# Patient Record
Sex: Male | Born: 1995
Health system: Southern US, Community
[De-identification: ages and names within clinical notes are randomized; demographics above are authoritative.]

## PROBLEM LIST (undated history)

## (undated) DIAGNOSIS — E669 Obesity, unspecified: Secondary | ICD-10-CM

## (undated) DIAGNOSIS — E7801 Familial hypercholesterolemia: Secondary | ICD-10-CM

## (undated) DIAGNOSIS — R011 Cardiac murmur, unspecified: Secondary | ICD-10-CM

## (undated) DIAGNOSIS — I10 Essential (primary) hypertension: Secondary | ICD-10-CM

## (undated) DIAGNOSIS — R7303 Prediabetes: Secondary | ICD-10-CM

## (undated) DIAGNOSIS — E301 Precocious puberty: Secondary | ICD-10-CM

## (undated) DIAGNOSIS — R1013 Epigastric pain: Secondary | ICD-10-CM

## (undated) DIAGNOSIS — K219 Gastro-esophageal reflux disease without esophagitis: Secondary | ICD-10-CM

## (undated) DIAGNOSIS — E063 Autoimmune thyroiditis: Secondary | ICD-10-CM

## (undated) DIAGNOSIS — N62 Hypertrophy of breast: Secondary | ICD-10-CM

## (undated) DIAGNOSIS — E049 Nontoxic goiter, unspecified: Secondary | ICD-10-CM

## (undated) HISTORY — DX: Nontoxic goiter, unspecified: E04.9

## (undated) HISTORY — DX: Essential (primary) hypertension: I10

## (undated) HISTORY — DX: Hypertrophy of breast: N62

## (undated) HISTORY — DX: Gastro-esophageal reflux disease without esophagitis: K21.9

## (undated) HISTORY — DX: Obesity, unspecified: E66.9

## (undated) HISTORY — PX: TONSILLECTOMY AND ADENOIDECTOMY: SHX28

## (undated) HISTORY — DX: Familial hypercholesterolemia: E78.01

## (undated) HISTORY — PX: TYMPANOSTOMY TUBE PLACEMENT: SHX32

## (undated) HISTORY — DX: Epigastric pain: R10.13

## (undated) HISTORY — DX: Precocious puberty: E30.1

## (undated) HISTORY — DX: Autoimmune thyroiditis: E06.3

## (undated) HISTORY — DX: Prediabetes: R73.03

---
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
## 2016-03-07 DIAGNOSIS — L0390 Cellulitis, unspecified: Secondary | ICD-10-CM | POA: Diagnosis not present

## 2016-03-14 DIAGNOSIS — L0390 Cellulitis, unspecified: Secondary | ICD-10-CM | POA: Diagnosis not present

## 2016-03-14 DIAGNOSIS — F411 Generalized anxiety disorder: Secondary | ICD-10-CM | POA: Diagnosis not present

## 2016-04-24 ENCOUNTER — Encounter: Payer: Self-pay | Admitting: "Endocrinology

## 2016-04-24 ENCOUNTER — Encounter: Payer: Self-pay | Admitting: *Deleted

## 2016-04-24 ENCOUNTER — Ambulatory Visit (INDEPENDENT_AMBULATORY_CARE_PROVIDER_SITE_OTHER): Payer: BLUE CROSS/BLUE SHIELD | Admitting: "Endocrinology

## 2016-04-24 VITALS — BP 116/75 | HR 56 | Wt 163.8 lb

## 2016-04-24 DIAGNOSIS — R1013 Epigastric pain: Secondary | ICD-10-CM | POA: Diagnosis not present

## 2016-04-24 DIAGNOSIS — E063 Autoimmune thyroiditis: Secondary | ICD-10-CM

## 2016-04-24 DIAGNOSIS — E038 Other specified hypothyroidism: Secondary | ICD-10-CM

## 2016-04-24 DIAGNOSIS — E049 Nontoxic goiter, unspecified: Secondary | ICD-10-CM

## 2016-04-24 LAB — T4, FREE: Free T4: 1 ng/dL (ref 0.8–1.4)

## 2016-04-24 LAB — T3, FREE: T3, Free: 3.4 pg/mL (ref 3.0–4.7)

## 2016-04-24 LAB — TSH: TSH: 3.49 mIU/L (ref 0.50–4.30)

## 2016-04-24 NOTE — Progress Notes (Signed)
Subjective:  Patient Name: Jeremy Duncan Date of Birth: 1996/02/25  MRN: [id number]  Jeremy Duncan  presents to the office today for follow-up of his acquired hypothyroidism, Hashimoto's disease, goiter, obesity, hyperlipidemia, hypertension, prediabetes, gynecomastia, dyspepsia, and GERD.  HISTORY OF PRESENT ILLNESS:   Jeremy Duncan is a 20 y.o. Caucasian young man. Jeremy Duncan was unaccompanied.  1. Patient was first referred to me on 10/21/07 by his PCP, Dr. Victorino Dike Summer of [facility], for evaluation and management of hypothyroidism. He was 11 years and 7 months old at the time.  A. The child was the product of an uncomplicated pregnancy. His mother had gestational diabetes mellitus. He was delivered by C-section. Birth weight was 6 lbs. 4 oz. He was a healthy newborn. He was also a healthy infant. Between the ages of 76 and 9 he experienced a marked increase in weight. One year he gained 20 pounds. He also developed hypertension. Laboratory tests on 06/13/07 showed a TSH of 6.09. Repeat studies on 06/17/07 showed a TSH of 6.283. Free T4 was 1.22. Dr. Vaughan Basta called me and I suggested that she start him on Synthroid 25 mcg per day. Thyroid function tests on 07/10/2007 showed that the TSH decreased to 4.055. I suggested that the Synthroid dose be increased to 37.5 mcg per day.   B. The patient's pertinent review of systems revealed that he was always hungry. He had been placed on Prevacid but was only taking it as needed. Previous laboratory tests had shown hypercholesterolemia and hyperinsulinemia. A fasting insulin value was 20 when the accompanying blood glucose was 85. Family history was positive for a maternal great-grandmother who took thyroid pills. It was believed that she had never had surgery or radiation to her neck. That same maternal great-grandmother had had cancer of her breast and bone. Both the father and mother had significant stomach acid issues.There was a family history of  overweight/obesity.   C. On physical examination his height was at the 75th percentile. His weight was at the 97th percentile. BMI was greater than the 97th percentile. His blood pressure was 131/86. He was definitely obese. He had a 12 g goiter. His right lobe was tender to palpation. Breast tissue was early Tanner stage II. The right areola was 22 mm. The left areola was 25 mm. He had Tanner stage II pubic hair. Testes were to 2-3 mL in volume. Lab tests that day showed a normal CMP. His TSH was 2.773. His free T4 was 1.14. His free T3 was 1.3. FSH was 2.8 and LH 0.4. Testosterone was 25.3. Estradiol was less than 10. Androstenedione was 65.  D. It appeared at that time that the patient had acquired primary hypothyroidism secondary to Hashimoto's disease. His thyroid gland was definitely tender, consistent with a flare-up of Hashimoto's disease at that time. He was obese, in part due to extreme hunger caused by dyspepsia. Family history was definitely positive for dyspepsia and overweight/obesity. Previous labs had shown hyperinsulinemia. Therefore, it was predictable that his testosterone would be slightly elevated for age. I felt that his hypertension was most likely a result of his obesity and that if he could lose enough weight, the hypertension would likely resolve as well. I started the child on metformin, 500 mg twice daily. I discussed with the family our Eat Right diet plan and our Exercise Right plan.  2. During the past 9 years, Jeremy Duncan has lost some additional thyroid cells. As a result, I increased his Synthroid dose to 50 mcg/day. Since then he  has remained euthyroid. During the first year, his weight dropped from the 97th to the 75th percentile, but then gradually rose again to about the 82nd percentile. He had a growth spurt at age 86. When his weight decreased to the 75th percentile, his blood pressure decreased to 111/69. When the weight then increased to the 82nd percentile, the blood  pressure increased to 125/82. His weight percentile peaked at 88% in October 2013, in part due to a large increase in muscle mass. The gynecomastia has gradually improved over time.   3. The patient's last [facility] visit was on 10/20/15. In the interim, he has been healthy. He feels good, has been working out a lot, and has been taking in fewer sugary drinks and fewer food carbs.  He has definitely gained muscle. He works out with weight 2-3 days per week and runs 2-3 days per week. He stopped taking metformin, 500 mg, twice daily due to not feeling it was needed. He does take his Synthroid, 50 mcg once daily.   4. Pertinent Review of Systems:  Constitutional: The patient  feels "good". Energy level is pretty good. He is active. Eyes: Vision has improved so he only needs to wear his glasses for distance. There are no other recognized eye problems. Neck: The patient has no complaints of anterior neck swelling, soreness, tenderness, pressure, discomfort, or difficulty swallowing.   Heart: Heart rate increases with exercise or other physical activity. The patient has no complaints of palpitations, irregular heart beats, chest pain, or chest pressure.   Gastrointestinal: He is not bothered by "belly hunger". Bowel movents seem normal. The patient has no complaints of acid reflux, upset stomach, stomach aches or pains, diarrhea, or constipation.  Hands: He texts and plays video games quite well.  Legs: Muscle mass and strength seem normal. There are no complaints of numbness, tingling, burning, or pain. No edema is noted.  Feet: There are no obvious foot problems. There are no complaints of numbness, tingling, burning, or pain. No edema is noted. Neurologic: There are no recognized problems with muscle movement and strength, sensation, or coordination. Breasts: Breast tissue has resolved. Skin: His striae have resolved.      PAST MEDICAL, FAMILY, AND SOCIAL HISTORY  Past Medical History  Diagnosis Date   . Acquired autoimmune hypothyroidism   . Thyroiditis, autoimmune   . Goiter   . Obesity   . Hyperlipidemia type II   . Hypertension   . Prediabetes   . Isosexual precocity   . Gynecomastia, male   . Dyspepsia   . GERD (gastroesophageal reflux disease)     Family History  Problem Relation Age of Onset  . Diabetes Mother     Gestational diabetes mellitus     Current outpatient prescriptions:  .  SYNTHROID 50 MCG tablet, TAKE 1 TABLET BY MOUTH EVERY DAY, Disp: 90 tablet, Rfl: 1 .  metFORMIN (GLUCOPHAGE) 500 MG tablet, TAKE 1 TABLET BY MOUTH TWICE A DAY (Patient not taking: Reported on 04/24/2016), Disp: 180 tablet, Rfl: 3   reports that he has never smoked. He has never used smokeless tobacco. Pediatric History  Patient Guardian Status  . Mother:  [place], [place]   Other Topics Concern  . Not on file   Social History Narrative   1. School and Family: The patient just finished his freshman year at [employer].  He will major in interior architecture.   2. Activities: He lifts weights 2-3 times per week. He runs 1-2 times per week for 30-60 minutes.  3. [facility] Care Provider[facility]ean [facility]  REVIEW OF SYSTEMS: There are no other significant problems involving Quy's other body systems.   Objective:  Vital Signs:  BP 116/75 mmHg  Pulse 56  Wt 163 lb 12.8 oz (74.299 kg)   Ht Readings from Last 3 Encounters:  12/28/14 5' 9.21" (1.758 m) (49 %*, Z = -0.03)  08/26/14 5' 9.37" (1.762 m) (53 %*, Z = 0.06)  03/18/13 5' 8.9" (1.75 m) (58 %*, Z = 0.20)   * Growth percentiles are based on CDC 2-20 Years data.   Wt Readings from Last 3 Encounters:  04/24/16 163 lb 12.8 oz (74.299 kg) (66 %*, Z = 0.42)  10/20/15 167 lb (75.751 kg) (73 %*, Z = 0.60)  05/31/15 169 lb 4.8 oz (76.794 kg) (77 %*, Z = 0.74)   * Growth percentiles are based on CDC 2-20 Years data.   There is no height on file to calculate BSA.  PHYSICAL EXAM:  Constitutional: The patient  appears healthy and much older and more mature than his chronologic age. He is definitely quite muscular. He is a very bright and very emotionally mature and socially mature young man.The patient's height growth has levelled off at the 49%. His weight percentile has decreased to the 66.34%. He is very muscular and so his weight is normal for his degree of increased muscle mass.   Head: The head is normocephalic. Face: The face appears normal. There are no obvious dysmorphic features. Eyes: The eyes appear to be normally formed and spaced. Gaze is conjugate. There is no obvious arcus or proptosis. Moisture appears normal. Ears: The ears are normally placed and appear externally normal. Mouth: The oropharynx and tongue appear normal. Dentition appears to be normal for age. Oral moisture is normal. Neck: The neck appears to be visibly normal. No carotid bruits are noted. The strap muscles are about 2-3 times thicker than they were 3-4 years ago, c/w weight lifting. It is more difficult to assess thyroid gland size now, but his thyroid is probably smaller at about 22 grams in size. Both lobes are symmetrically enlarged. The consistency of the thyroid gland is fairly firm bilaterally. The thyroid gland is not tender to palpation. Lungs: The lungs are clear to auscultation. Air movement is good. Heart: Heart rate and rhythm are regular. Heart sounds S1 and S2 are normal. I did not appreciate any pathologic cardiac murmurs. Abdomen: The abdomen is normal in size for the patient's age. Bowel sounds are normal. There is no obvious hepatomegaly, splenomegaly, or other mass effect.  Arms: Muscle size and bulk are normal for age. Hands: There is no tremor. Phalangeal and metacarpophalangeal joints are normal. Palmar muscles are normal for age. He has no significant palmar erythema. He has no excess palmar moisture.  Legs: Muscles appear normal for age. No edema is present. Neurologic: Strength is normal for age in  both the upper and lower extremities. Muscle tone is normal. Sensation to touch is normal in both legs.   Skin: He has no striae today.   LAB DATA:   Labs 10/23/15: TSH 1.657, free T4 1.20, free T3 3.5  Labs 05/31/15: HbA1c 5.1%; TSH 2.318, free T4 1.26, free T3 3.9  Labs 11/09/14: 24-hour urine free cortisol 3.6, compared with 65.9 three months previously. 24-hour urine creatinine was 2090; TSH 2.836, free T4 1.04, free T3 4.0  Labs 08/22/14: HbA1c 5.6%, compared with 5.1% art last visit and with 4.4% two years ago. TSH  2.675, free T4 1.18, free T3 4.4  Labs 02/24/13: TSH 1.986, free T4 1.17, free T3 3.7   Labs 12/28/11: TSH 2.544, Free T4 1.49, Free T3 4.1   Labs 05/13/11: TSH 1.642. Free T4 1.21. Free T3 4.0.   Assessment and Plan:   ASSESSMENT:  1.  Hypothyroid: The patient was euthyroid in January 2013, in March 2014, in September 2015, in December 2015, although his TSH had been >2.0. In November 2016, however, his TFTs were mid-range normal. His treatment TSH goal is between 1.0-2.0. 2.  Thyroiditis: His thyroid inflammation is clinically quiescent. The shift of all three TFTs upward or downward together that he has had in the past is pathognomonic for a flare up of Hashimoto's disease.  3.  Goiter: Thyroid gland is slightly smaller than at last visit. The waxing and waning of the thyroid gland size is consistent with episodes of thyroid inflammation. 4.  Overweight: Jeremy Duncan is muscular. He is not overweight. 5. Hypertension: His SBP is good. His DBP is still a bit high. The hypertension will probably resolve if he continues to exercise and is careful with what he eats, but he may need antihypertensive medications in the future.     6. Dyspepsia: This problem is quiescent.    7. Pre-diabetes: His A1c was mid-range normal again at his prior visit. Unless he gains a lot of fat weight there will not be any need to follow this problem.  8. Striae: His striae have resolved.   PLAN:  1.  Diagnostic: TFTs  this week and again in 6 months. 2. Therapeutic:  Continue current doses of Synthroid. Try to fit in daily cardio exercise. I encouraged him to drink more water.  3. Patient education: We talked about the need to continue to eat right and to exercise. We also talked about the natural course of Hashimoto's disease and hypothyroidism. 4. Follow-up: 6 months  Level of Service: This visit lasted in excess of 45 minutes. More than 50% of the visit was devoted to counseling.  David Stall, MD  ADDENDUM:   Labs 04/24/16: TSH 3.49, free T4 1.0, free T3 3.4  Assessment: Jeremy Duncan is hypothyroid again. I called him. He admitted that he had missed several doses of Synthroid in the week prior to getting the lab tests done.  Plan: Repeat TFTs in 2 months. Decide then whether or not to increase his synthroid dose.   David Stall

## 2016-04-24 NOTE — Patient Instructions (Addendum)
Follow up visit in late November or early December.  Please repeat lab tests one week prior.

## 2016-04-25 ENCOUNTER — Telehealth: Payer: Self-pay | Admitting: "Endocrinology

## 2016-04-25 DIAGNOSIS — E063 Autoimmune thyroiditis: Secondary | ICD-10-CM

## 2016-04-25 NOTE — Telephone Encounter (Signed)
1. I called [place] to notify him that his TFTs from yesterday were hypothyroid.  2. Subjective; He now admits that he missed several Synthroid doses in the week prior to seeing me and having his TFTs drawn. 3. Assessment: He may have missed just enough doses to cause his TSH top rise but keep his free T4 and free T3 fairly good. 4. Plan: Will repeat TFTs in 2 months and decide then whether or not to increase his Synthroid dose.  David StallBRENNAN,Maciel Kegg J

## 2016-05-30 ENCOUNTER — Other Ambulatory Visit: Payer: Self-pay | Admitting: "Endocrinology

## 2016-10-05 ENCOUNTER — Other Ambulatory Visit (INDEPENDENT_AMBULATORY_CARE_PROVIDER_SITE_OTHER): Payer: Self-pay | Admitting: *Deleted

## 2016-10-05 DIAGNOSIS — E034 Atrophy of thyroid (acquired): Secondary | ICD-10-CM

## 2016-10-05 DIAGNOSIS — R7303 Prediabetes: Secondary | ICD-10-CM

## 2016-10-05 MED ORDER — METFORMIN HCL 500 MG PO TABS: 500.0000 mg | ORAL_TABLET | Freq: Two times a day (BID) | ORAL | 4 refills | Status: DC

## 2016-10-05 MED ORDER — SYNTHROID 50 MCG PO TABS: 50.0000 ug | ORAL_TABLET | Freq: Every day | ORAL | 4 refills | Status: DC

## 2016-10-21 DIAGNOSIS — Z23 Encounter for immunization: Secondary | ICD-10-CM | POA: Diagnosis not present

## 2016-10-30 ENCOUNTER — Ambulatory Visit (INDEPENDENT_AMBULATORY_CARE_PROVIDER_SITE_OTHER): Payer: Self-pay | Admitting: "Endocrinology

## 2016-11-10 ENCOUNTER — Ambulatory Visit (INDEPENDENT_AMBULATORY_CARE_PROVIDER_SITE_OTHER): Payer: Self-pay | Admitting: "Endocrinology

## 2016-11-14 ENCOUNTER — Ambulatory Visit (INDEPENDENT_AMBULATORY_CARE_PROVIDER_SITE_OTHER): Payer: BLUE CROSS/BLUE SHIELD | Admitting: "Endocrinology

## 2016-11-14 ENCOUNTER — Encounter (INDEPENDENT_AMBULATORY_CARE_PROVIDER_SITE_OTHER): Payer: Self-pay | Admitting: "Endocrinology

## 2016-11-14 VITALS — BP 118/72 | HR 66 | Wt 164.0 lb

## 2016-11-14 DIAGNOSIS — E038 Other specified hypothyroidism: Secondary | ICD-10-CM

## 2016-11-14 DIAGNOSIS — E049 Nontoxic goiter, unspecified: Secondary | ICD-10-CM | POA: Diagnosis not present

## 2016-11-14 DIAGNOSIS — E063 Autoimmune thyroiditis: Secondary | ICD-10-CM

## 2016-11-14 DIAGNOSIS — R1013 Epigastric pain: Secondary | ICD-10-CM | POA: Diagnosis not present

## 2016-11-14 DIAGNOSIS — I10 Essential (primary) hypertension: Secondary | ICD-10-CM

## 2016-11-14 LAB — T4, FREE: FREE T4: 1.2 ng/dL (ref 0.8–1.4)

## 2016-11-14 LAB — T3, FREE: T3, Free: 3.5 pg/mL (ref 3.0–4.7)

## 2016-11-14 LAB — TSH: TSH: 0.79 m[IU]/L (ref 0.50–4.30)

## 2016-11-14 NOTE — Progress Notes (Signed)
Subjective:  Patient Name: Jeremy Duncan Date of Birth: Jul 27, 1996  MRN: [id number]  Jeremy Duncan  presents to the office today for follow-up of his acquired hypothyroidism, Hashimoto's disease, goiter, obesity, hyperlipidemia, hypertension, prediabetes, gynecomastia, dyspepsia, and GERD.  HISTORY OF PRESENT ILLNESS:   [name] is a 20 y.o. Caucasian young man. Jeremy Duncan was unaccompanied.  1. Jeremy Duncan was first referred to me on 10/21/07 by his PCP, Dr. Victorino Dike [facility] of [facility], for evaluation and management of hypothyroidism. He was 11 years and 7 months old at the time.  A. The child was the product of an uncomplicated pregnancy. His mother had gestational diabetes mellitus. He was delivered by C-section. Birth weight was 6 lbs. 4 oz. He was a healthy newborn. He was also a healthy infant. Between the ages of 61 and 9 he experienced a marked increase in weight. One year he gained 20 pounds. He also developed hypertension. Laboratory tests on 06/13/07 showed a TSH of 6.09. Repeat studies on 06/17/07 showed a TSH of 6.283. Free T4 was 1.22. Dr. Vaughan Basta called me and I suggested that she start him on Synthroid 25 mcg per day. Thyroid function tests on 07/10/2007 showed that the TSH decreased to 4.055. I suggested that the Synthroid dose be increased to 37.5 mcg per day.   B. The patient's pertinent review of systems revealed that he was always hungry. He had been placed on Prevacid but was only taking it as needed. Previous laboratory tests had shown hypercholesterolemia and hyperinsulinemia. A fasting insulin value was 20 when the accompanying blood glucose was 85. Family history was positive for a maternal great-grandmother who took thyroid pills. It was believed that she had never had surgery or radiation to her neck. That same maternal great-grandmother had had cancer of her breast and bone. Both the father and mother had significant stomach acid issues.There was a family history of  overweight/obesity.   C. On physical examination his height was at the 75th percentile. His weight was at the 97th percentile. BMI was greater than the 97th percentile. His blood pressure was 131/86. He was definitely obese. He had a 12 g goiter. His right lobe was tender to palpation. Breast tissue was early Tanner stage II. The right areola was 22 mm. The left areola was 25 mm. He had Tanner stage II pubic hair. Testes were to 2-3 mL in volume. Lab tests that day showed a normal CMP. His TSH was 2.773. His free T4 was 1.14. His free T3 was 1.3. FSH was 2.8 and LH 0.4. Testosterone was 25.3. Estradiol was less than 10. Androstenedione was 65.  D. It appeared at that time that the patient had acquired primary hypothyroidism secondary to Hashimoto's disease. His thyroid gland was definitely tender, consistent with a flare-up of Hashimoto's disease at that time. He was obese, in part due to extreme hunger caused by dyspepsia. Family history was definitely positive for dyspepsia and overweight/obesity. Previous labs had shown hyperinsulinemia. Therefore, it was predictable that his testosterone would be slightly elevated for age. I felt that his hypertension was most likely a result of his obesity and that if he could lose enough weight, the hypertension would likely resolve as well. I started the child on metformin, 500 mg twice daily. I discussed with the family our Eat Right diet plan and our Exercise Right plan.  2. During the past 9 years, Kebin has lost some additional thyroid cells. As a result, I increased his Synthroid dose to 50 mcg/day. Since then he  has remained euthyroid. During the first year, his weight dropped from the 97th to the 75th percentile, but then gradually rose again to about the 82nd percentile. He had a growth spurt at age 30. When his weight decreased to the 75th percentile, his blood pressure decreased to 111/69. When the weight then increased to the 82nd percentile, the blood  pressure increased to 125/82. His weight percentile peaked at 88% in October 2013, in part due to a large increase in muscle mass. The gynecomastia has gradually resolved over time.   3. The patient's last [facility] visit was on 04/24/16. In the interim, he has been healthy. He feels good. He resumed weight lifting 2-3 times per week.  He does take his Synthroid, 50 mcg once daily most of the time.    4. Pertinent Review of Systems:  Constitutional: The patient  feels "good". Energy level is good. He is active. Eyes: Vision has improved so he only needs to wear his glasses for distance. There are no other recognized eye problems. Neck: The patient has no complaints of anterior neck swelling, soreness, tenderness, pressure, discomfort, or difficulty swallowing.   Heart: Heart rate increases with exercise or other physical activity. The patient has no complaints of palpitations, irregular heart beats, chest pain, or chest pressure.   Gastrointestinal: He probably has a little bit more belly hunger than at his last visit. Bowel movents seem normal. The patient has no complaints of acid reflux, upset stomach, stomach aches or pains, diarrhea, or constipation.  Hands: He texts and plays video games quite well.  Legs: Muscle mass and strength seem normal. There are no complaints of numbness, tingling, burning, or pain. No edema is noted.  Feet: There are no obvious foot problems. There are no complaints of numbness, tingling, burning, or pain. No edema is noted. Neurologic: There are no recognized problems with muscle movement and strength, sensation, or coordination. Breasts: Breast tissue remains normal. Skin: His striae have not recurred.      PAST MEDICAL, FAMILY, AND SOCIAL HISTORY  Past Medical History:  Diagnosis Date  . Acquired autoimmune hypothyroidism   . Dyspepsia   . GERD (gastroesophageal reflux disease)   . Goiter   . Gynecomastia, male   . Hyperlipidemia type II   . Hypertension   .  Isosexual precocity   . Obesity   . Prediabetes   . Thyroiditis, autoimmune     Family History  Problem Relation Age of Onset  . Diabetes Mother     Gestational diabetes mellitus     Current Outpatient Prescriptions:  .  metFORMIN (GLUCOPHAGE) 500 MG tablet, Take 1 tablet (500 mg total) by mouth 2 (two) times daily., Disp: 180 tablet, Rfl: 4 .  SYNTHROID 50 MCG tablet, Take 1 tablet (50 mcg total) by mouth daily., Disp: 90 tablet, Rfl: 4   reports that he has never smoked. He has never used smokeless tobacco. Pediatric History  Patient Guardian Status  . Mother:  Arney, Mayabb   Other Topics Concern  . Not on file   Social History Narrative  . No narrative on file   1. School and Family: The patient is in his sophomore year at [employer].  He will major in interior architecture.   2. Activities: He lifts weights 2-3 times per week. 3. Primary Care Provider: Dr. Lupe Carney, [facility]  REVIEW OF SYSTEMS: There are no other significant problems involving Raheel's other body systems.   Objective:  Vital Signs:  BP 118/72  Pulse 66   Wt 164 lb (74.4 kg)    Ht Readings from Last 3 Encounters:  12/28/14 5' 9.21" (1.758 m) (49 %, Z= -0.03)*  08/26/14 5' 9.37" (1.762 m) (53 %, Z= 0.06)*  03/18/13 5' 8.9" (1.75 m) (58 %, Z= 0.20)*   * Growth percentiles are based on CDC 2-20 Years data.   Wt Readings from Last 3 Encounters:  11/14/16 164 lb (74.4 kg) (64 %, Z= 0.36)*  04/24/16 163 lb 12.8 oz (74.3 kg) (66 %, Z= 0.42)*  10/20/15 167 lb (75.8 kg) (73 %, Z= 0.60)*   * Growth percentiles are based on CDC 2-20 Years data.   There is no height or weight on file to calculate BSA.  PHYSICAL EXAM:  Constitutional: The patient appears healthy and much older and more mature than his chronologic age. He is definitely quite muscular. He is a very bright and very emotionally mature and socially mature young man.The patient's height growth has levelled off at the 49%. His  weight percentile has decreased to the 64%. He is very muscular and so his weight is normal for his degree of increased muscle mass.   Head: The head is normocephalic. Face: The face appears normal. There are no obvious dysmorphic features. Eyes: The eyes appear to be normally formed and spaced. Gaze is conjugate. There is no obvious arcus or proptosis. Moisture appears normal. Ears: The ears are normally placed and appear externally normal. Mouth: The oropharynx and tongue appear normal. Dentition appears to be normal for age. Oral moisture is normal. Neck: The neck appears to be visibly enlarged. No carotid bruits are noted. The strap muscles are about 2-3 times thicker than they were 3-4 years ago, c/w weight lifting. It is more difficult to assess thyroid gland size now, but his thyroid is probably larger at about 23+ grams in size. The left lobe is much larger today, while the right lobe is smaller than at his last visit. The consistency of the thyroid gland is fairly firm on the left and normal on the right. The thyroid gland is not tender to palpation. Lungs: The lungs are clear to auscultation. Air movement is good. Heart: Heart rate and rhythm are regular. Heart sounds S1 and S2 are normal. I did not appreciate any pathologic cardiac murmurs. Abdomen: The abdomen is normal in size for the patient's age. Bowel sounds are normal. There is no obvious hepatomegaly, splenomegaly, or other mass effect.  Arms: Muscle size and bulk are normal for age. Hands: There is a 2+ tremor. Phalangeal and metacarpophalangeal joints are normal. Palmar muscles are normal for age. He has no significant palmar erythema. He has no excess palmar moisture.  Legs: Muscles appear normal for age. No edema is present. Neurologic: Strength is normal for age in both the upper and lower extremities. Muscle tone is normal. Sensation to touch is normal in both legs.   Skin: He has no striae today.   LAB DATA:   Lab s  04/24/16: TSH 3.49, free T4 1.0, free T3 3.4 after missing several Synthroid doses  Labs 10/23/15: TSH 1.657, free T4 1.20, free T3 3.5  Labs 05/31/15: HbA1c 5.1%; TSH 2.318, free T4 1.26, free T3 3.9  Labs 11/09/14: 24-hour urine free cortisol 3.6, compared with 65.9 three months previously. 24-hour urine creatinine was 2090; TSH 2.836, free T4 1.04, free T3 4.0  Labs 08/22/14: HbA1c 5.6%, compared with 5.1% art last visit and with 4.4% two years ago. TSH 2.675, free T4 1.18,  free T3 4.4  Labs 02/24/13: TSH 1.986, free T4 1.17, free T3 3.7   Labs 12/28/11: TSH 2.544, Free T4 1.49, Free T3 4.1   Labs 05/13/11: TSH 1.642. Free T4 1.21. Free T3 4.0.   Assessment and Plan:   ASSESSMENT:  1.  Hypothyroid:   A. The patient was euthyroid in January 2013, in March 2014, in September 2015, and in December 2015, although his TSH had been >2.0. In November 2016, however, his TFTs were mid-range normal. In May 2017, however, his TSH was elevated after missing many Synthroid doses. I asked him to take the medication daily. He was supposed to have his TFTs repeated in July, but did not do so. We will repeat his TFTs today.   B. His treatment TSH goal is between 1.0-2.0. 2.  Thyroiditis: His thyroid inflammation is clinically quiescent. The shift of all three TFTs upward or downward together that he has had in the past is pathognomonic for a flare up of Hashimoto's disease.  3.  Goiter: Thyroid gland is much larger than at last visit and the lobes have shifted significantly in size.  The waxing and waning of the thyroid gland size is consistent with episodes of thyroid inflammation. 4.  Overweight: Audric is muscular. He is not overweight. 5. Hypertension: His SBP is good. His DBP is lower, but still a bit high. The hypertension will probably resolve if he continues to exercise and is careful with what he eats, but he may need antihypertensive medications in the future.     6. Dyspepsia: This problem is  mildly active, probably due in part to his caffeine intake.     7. Pre-diabetes: His A1c was mid-range normal again at his prior visit. Unless he gains a lot of fat weight there will not be any need to follow this problem.  8. Striae: His striae have resolved.   PLAN:  1. Diagnostic: TFTs  this week and again in 6 months. 2. Therapeutic:  Continue current doses of Synthroid. Try to fit in daily cardio exercise. I encouraged him to drink more water.  3. Patient education: We talked about the need to continue to eat right and to exercise. We also talked about the natural course of Hashimoto's disease and hypothyroidism. I suspect that he will become progressively more hypothyroid over time. 4. Follow-up: 6 months. Call if having more dyspepsia.  Level of Service: This visit lasted in excess of 45 minutes. More than 50% of the visit was devoted to counseling.  David StallBRENNAN,Oriyah Lamphear J, MD, CDE Adult and Pediatric Endocrinology

## 2016-11-14 NOTE — Patient Instructions (Signed)
Follow up visit in 6 months. Please repeat blood tests 1-2 weeks prior.  

## 2016-11-15 LAB — THYROGLOBULIN ANTIBODY PANEL
Thyroglobulin Ab: <1 IU/mL (ref <2)
Thyroglobulin: 6.1 ng/mL
Thyroperoxidase Ab SerPl-aCnc: <1 IU/mL (ref <9)

## 2016-11-16 DIAGNOSIS — F411 Generalized anxiety disorder: Secondary | ICD-10-CM | POA: Diagnosis not present

## 2016-11-21 ENCOUNTER — Encounter (INDEPENDENT_AMBULATORY_CARE_PROVIDER_SITE_OTHER): Payer: Self-pay | Admitting: *Deleted

## 2017-05-15 ENCOUNTER — Ambulatory Visit (INDEPENDENT_AMBULATORY_CARE_PROVIDER_SITE_OTHER): Payer: BLUE CROSS/BLUE SHIELD | Admitting: "Endocrinology

## 2017-07-05 ENCOUNTER — Encounter (INDEPENDENT_AMBULATORY_CARE_PROVIDER_SITE_OTHER): Payer: Self-pay | Admitting: "Endocrinology

## 2017-07-05 ENCOUNTER — Ambulatory Visit (INDEPENDENT_AMBULATORY_CARE_PROVIDER_SITE_OTHER): Payer: BLUE CROSS/BLUE SHIELD | Admitting: "Endocrinology

## 2017-07-05 VITALS — BP 118/76 | HR 68 | Wt 170.6 lb

## 2017-07-05 DIAGNOSIS — E063 Autoimmune thyroiditis: Secondary | ICD-10-CM | POA: Diagnosis not present

## 2017-07-05 DIAGNOSIS — E049 Nontoxic goiter, unspecified: Secondary | ICD-10-CM

## 2017-07-05 DIAGNOSIS — R7303 Prediabetes: Secondary | ICD-10-CM | POA: Diagnosis not present

## 2017-07-05 DIAGNOSIS — R1013 Epigastric pain: Secondary | ICD-10-CM | POA: Diagnosis not present

## 2017-07-05 DIAGNOSIS — I10 Essential (primary) hypertension: Secondary | ICD-10-CM

## 2017-07-05 LAB — T4, FREE: FREE T4: 1.3 ng/dL (ref 0.8–1.4)

## 2017-07-05 LAB — TSH: TSH: 1.64 m[IU]/L (ref 0.40–4.50)

## 2017-07-05 LAB — POCT GLYCOSYLATED HEMOGLOBIN (HGB A1C): HEMOGLOBIN A1C: 4.9

## 2017-07-05 LAB — POCT GLUCOSE (DEVICE FOR HOME USE): POC Glucose: 159 mg/dl — AB (ref 70–99)

## 2017-07-05 LAB — T3, FREE: T3, Free: 3.6 pg/mL (ref 3.0–4.7)

## 2017-07-05 NOTE — Progress Notes (Signed)
Subjective:  Patient Name: Jeremy Duncan Date of Birth: Jul 25, 1996  MRN: [id number]  Artice Bergerson  presents to the office today for follow-up of his acquired hypothyroidism, Hashimoto's disease, goiter, obesity, hyperlipidemia, hypertension, prediabetes, gynecomastia, dyspepsia, and GERD.  HISTORY OF PRESENT ILLNESS:   [name] is a 21 y.o. Caucasian young man. Sem was unaccompanied.  1. Jeremy Duncan was first referred to me on 10/21/07 by his PCP, Dr. Victorino Dike Summer of [facility], for evaluation and management of hypothyroidism. He was 11 years and 7 months old at the time.  A. The child was the product of an uncomplicated pregnancy. His mother had gestational diabetes mellitus. He was delivered by C-section. Birth weight was 6 lbs. 4 oz. He was a healthy newborn. He was also a healthy infant. Between the ages of 73 and 9 he experienced a marked increase in weight. One year he gained 20 pounds. He also developed hypertension. Laboratory tests on 06/13/07 showed a TSH of 6.09. Repeat studies on 06/17/07 showed a TSH of 6.283. Free T4 was 1.22. Dr. Vaughan Basta called me and I suggested that she start him on Synthroid 25 mcg per day. Thyroid function tests on 07/10/2007 showed that the TSH decreased to 4.055. I suggested that the Synthroid dose be increased to 37.5 mcg per day.   B. The patient's pertinent review of systems revealed that he was always hungry. He had been placed on Prevacid but was only taking it as needed. Previous laboratory tests had shown hypercholesterolemia and hyperinsulinemia. A fasting insulin value was 20 when the accompanying blood glucose was 85. Family history was positive for a maternal great-grandmother who took thyroid pills. It was believed that she had never had surgery or radiation to her neck. That same maternal great-grandmother had had cancer of her breast and bone. Both the father and mother had significant stomach acid issues.There was a family history of  overweight/obesity.   C. On physical examination his height was at the 75th percentile. His weight was at the 97th percentile. BMI was greater than the 97th percentile. His blood pressure was 131/86. He was definitely obese. He had a 12 gm goiter. His right lobe was tender to palpation. Breast tissue was early Tanner stage II. The right areola was 22 mm. The left areola was 25 mm. He had Tanner stage II pubic hair. Testes were to 2-3 mL in volume. Lab tests that day showed a normal CMP. His TSH was 2.773. His free T4 was 1.14. His free T3 was 1.3. FSH was 2.8 and LH 0.4. Testosterone was 25.3. Estradiol was less than 10. Androstenedione was 65.  D. It appeared at that time that the patient had acquired primary hypothyroidism secondary to Hashimoto's disease. His thyroid gland was definitely tender, consistent with a flare-up of Hashimoto's disease at that time. He was obese, in part due to extreme hunger caused by dyspepsia. Family history was definitely positive for dyspepsia and overweight/obesity. Previous labs had shown hyperinsulinemia. Therefore, it was predictable that his testosterone would be slightly elevated for age. I felt that his hypertension was most likely a result of his obesity and that if he could lose enough weight, the hypertension would likely resolve as well. I started the child on metformin, 500 mg twice daily. I discussed with the family our Eat Right diet plan and our Exercise Right plan.  2. During the past 10 years, Jeremy Duncan has lost some additional thyroid cells. As a result, I increased his Synthroid dose to 50 mcg/day. Since then he  has remained euthyroid. During the first year, his weight dropped from the 97th to the 75th percentile, but then gradually rose again to about the 82nd percentile. He had a growth spurt at age 52. When his weight decreased to the 75th percentile, his blood pressure decreased to 111/69. When the weight then increased to the 82nd percentile, the blood  pressure increased to 125/82. His weight percentile peaked at 88% in October 2013, in part due to a large increase in muscle mass. The gynecomastia has gradually resolved over time.   3. The patient's last [facility] visit was on 11/14/16. In the interim, he has been healthy. He feels good. He reduced his weight lifting to 1-2 times per week.  He does take his Synthroid, 50 mcg once daily most of the time.    4. Pertinent Review of Systems:  Constitutional: The patient  feels "pretty good". Energy level is good. He is active. Eyes: Vision has improved so he only needs to wear his glasses for distance. There are no other recognized eye problems. Neck: The patient has no complaints of anterior neck swelling, soreness, tenderness, pressure, discomfort, or difficulty swallowing.   Heart: Heart rate increases with exercise or other physical activity. The patient has no complaints of palpitations, irregular heart beats, chest pain, or chest pressure.   Gastrointestinal: He probably has a little more belly hunger than at his last visit. Bowel movents seem normal. The patient has no complaints of acid reflux, upset stomach, stomach aches or pains, diarrhea, or constipation.  Hands: He texts and plays video games quite well.  Legs: Muscle mass and strength seem normal. There are no complaints of numbness, tingling, burning, or pain. No edema is noted.  Feet: There are no obvious foot problems. There are no complaints of numbness, tingling, burning, or pain. No edema is noted. Neurologic: There are no recognized problems with muscle movement and strength, sensation, or coordination. Breasts: Breast tissue remains normal. Skin: His striae have not recurred.      PAST MEDICAL, FAMILY, AND SOCIAL HISTORY  Past Medical History:  Diagnosis Date  . Acquired autoimmune hypothyroidism   . Dyspepsia   . GERD (gastroesophageal reflux disease)   . Goiter   . Gynecomastia, male   . Hyperlipidemia type II   .  Hypertension   . Isosexual precocity   . Obesity   . Prediabetes   . Thyroiditis, autoimmune     Family History  Problem Relation Age of Onset  . Diabetes Mother        Gestational diabetes mellitus     Current Outpatient Prescriptions:  .  SYNTHROID 50 MCG tablet, Take 1 tablet (50 mcg total) by mouth daily., Disp: 90 tablet, Rfl: 4 .  metFORMIN (GLUCOPHAGE) 500 MG tablet, Take 1 tablet (500 mg total) by mouth 2 (two) times daily. (Patient not taking: Reported on 07/05/2017), Disp: 180 tablet, Rfl: 4   reports that he has never smoked. He has never used smokeless tobacco. Pediatric History  Patient Guardian Status  . Mother:  Jerold, Yoss   Other Topics Concern  . Not on file   Social History Narrative  . No narrative on file   1. School and Family: The patient will start his junior year [employer].  He will major in interior architecture.   2. Activities: He lifts weights 1-2 times per week. 3. Primary Care Provider: Dr. Lupe Carney, [facility]  REVIEW OF SYSTEMS: There are no other significant problems involving Maxwell's other body systems.  Objective:  Vital Signs:  BP 118/76   Pulse 68   Wt 170 lb 9.6 oz (77.4 kg)    Ht Readings from Last 3 Encounters:  12/28/14 5' 9.21" (1.758 m) (49 %, Z= -0.03)*  08/26/14 5' 9.37" (1.762 m) (53 %, Z= 0.06)*  03/18/13 5' 8.9" (1.75 m) (58 %, Z= 0.20)*   * Growth percentiles are based on CDC 2-20 Years data.   Wt Readings from Last 3 Encounters:  07/05/17 170 lb 9.6 oz (77.4 kg)  11/14/16 164 lb (74.4 kg) (64 %, Z= 0.36)*  04/24/16 163 lb 12.8 oz (74.3 kg) (66 %, Z= 0.42)*   * Growth percentiles are based on CDC 2-20 Years data.   There is no height or weight on file to calculate BSA.  PHYSICAL EXAM:  Constitutional: The patient appears healthy and much older and more mature than his chronologic age. He is definitely quite muscular. He is a very bright and very emotionally mature and socially mature young  man.The patient's height growth has ceased. His weight has increased by 6 pounds in 8 months, equivalent to about an excess 95 calories per day. He is very muscular. Head: The head is normocephalic. Face: The face appears normal. There are no obvious dysmorphic features. Eyes: The eyes appear to be normally formed and spaced. Gaze is conjugate. There is no obvious arcus or proptosis. Moisture appears normal. Ears: The ears are normally placed and appear externally normal. Mouth: The oropharynx and tongue appear normal. Dentition appears to be normal for age. Oral moisture is normal. Neck: The neck appears to be visibly enlarged. No carotid bruits are noted. The strap muscles are about 2-3 times thicker than they were 3-4 years ago, c/w weight lifting. It is more difficult to assess thyroid gland size now, but his thyroid is probably still enlarged at about 23+ grams in size. The left lobe is much larger than the right. The consistency of the thyroid gland is fairly firm on the left and normal on the right. The thyroid gland is not tender to palpation. Lungs: The lungs are clear to auscultation. Air movement is good. Heart: Heart rate and rhythm are regular. Heart sounds S1 and S2 are normal. I did not appreciate any pathologic cardiac murmurs. Abdomen: The abdomen is normal in size for the patient's age, but he has more abdominal fat than he had at his last visit. Bowel sounds are normal. There is no obvious hepatomegaly, splenomegaly, or other mass effect.  Arms: Muscle size and bulk are normal for age. Hands: There is no tremor. Phalangeal and metacarpophalangeal joints are normal. Palmar muscles are normal for age. He has no significant palmar erythema. He has no excess palmar moisture.  Legs: Muscles appear normal for age. No edema is present. Neurologic: Strength is normal for age in both the upper and lower extremities. Muscle tone is normal. Sensation to touch is normal in both legs.   Skin: He  has no striae today.   LAB DATA:   Labs 07/05/17: HbA1c 4.9%, CBG 159  Labs 11/21/16: TSH 0.79, free T4 1.2, free T3 3.5  Lab s 04/24/16: TSH 3.49, free T4 1.0, free T3 3.4 after missing several Synthroid doses  Labs 10/23/15: TSH 1.657, free T4 1.20, free T3 3.5  Labs 05/31/15: HbA1c 5.1%; TSH 2.318, free T4 1.26, free T3 3.9  Labs 11/09/14: 24-hour urine free cortisol 3.6, compared with 65.9 three months previously. 24-hour urine creatinine was 2090; TSH 2.836, free T4 1.04, free T3  4.0  Labs 08/22/14: HbA1c 5.6%, compared with 5.1% art last visit and with 4.4% two years ago. TSH 2.675, free T4 1.18, free T3 4.4  Labs 02/24/13: TSH 1.986, free T4 1.17, free T3 3.7   Labs 12/28/11: TSH 2.544, Free T4 1.49, Free T3 4.1   Labs 05/13/11: TSH 1.642. Free T4 1.21. Free T3 4.0.   Assessment and Plan:   ASSESSMENT:  1.  Hypothyroid:   A. The patient was euthyroid in January 2013, in March 2014, in September 2015, and in December 2015, although his TSH had been >2.0. In November 2016 his TFTs were mid-range normal. In May 2017, however, his TSH was elevated after missing many Synthroid doses. I asked him to take the medication daily. His TFTS in December 2017 were at about the 80% of the normal range. He was supposed to have his TFTs repeated prior to this visit, but did not do so. We will repeat his TFTs today.   B. His treatment TSH goal is between 1.0-2.0. 2.  Thyroiditis: His thyroid inflammation is clinically quiescent. The shift of all three TFTs upward or downward together that he has had in the past is pathognomonic for a flare up of Hashimoto's disease.  3.  Goiter: Thyroid gland is probably about the same size as at his last visit. The waxing and waning of the thyroid gland size is consistent with episodes of thyroid inflammation. 4.  Overweight: Larsen is muscular. He is not overweight. 5. Hypertension: His BP is good today.      6. Dyspepsia: This problem is mildly active, probably  due in part to his caffeine intake.     7. Pre-diabetes: His A1c was mid-range normal again today. He is not "pre-diabetic" any longer in a clinical sense. Unless he gains a lot of fat weight there will not be any need to follow this problem.  8. Striae: His striae have resolved.   PLAN:  1. Diagnostic: TFTs today and again in 6 months. 2. Therapeutic:  Continue current doses of Synthroid. Try to fit in daily cardio exercise, but at least 3 times per week. I encouraged him to drink more water. If his belly hunger worsens he may need to resume taking ranitidine.  3. Patient education: We talked about the need to continue to eat right and to exercise. We also talked about the natural course of Hashimoto's disease and hypothyroidism. I suspect that he will become progressively more hypothyroid over time. 4. Follow-up: 6 months. Call if having more dyspepsia.  Level of Service: This visit lasted in excess of 45 minutes. More than 50% of the visit was devoted to counseling.  Molli KnockMichael Honorio Devol, MD, CDE Adult and Pediatric Endocrinology

## 2017-07-05 NOTE — Patient Instructions (Signed)
Follow up visit in about 6 months.

## 2017-07-19 ENCOUNTER — Encounter (INDEPENDENT_AMBULATORY_CARE_PROVIDER_SITE_OTHER): Payer: Self-pay

## 2017-10-14 ENCOUNTER — Other Ambulatory Visit (INDEPENDENT_AMBULATORY_CARE_PROVIDER_SITE_OTHER): Payer: Self-pay | Admitting: "Endocrinology

## 2017-10-14 DIAGNOSIS — E034 Atrophy of thyroid (acquired): Secondary | ICD-10-CM

## 2017-10-14 DIAGNOSIS — R7303 Prediabetes: Secondary | ICD-10-CM

## 2018-01-08 ENCOUNTER — Ambulatory Visit (INDEPENDENT_AMBULATORY_CARE_PROVIDER_SITE_OTHER): Payer: BLUE CROSS/BLUE SHIELD | Admitting: "Endocrinology

## 2018-02-06 ENCOUNTER — Ambulatory Visit (INDEPENDENT_AMBULATORY_CARE_PROVIDER_SITE_OTHER): Payer: BLUE CROSS/BLUE SHIELD | Admitting: "Endocrinology

## 2018-02-13 ENCOUNTER — Encounter (INDEPENDENT_AMBULATORY_CARE_PROVIDER_SITE_OTHER): Payer: Self-pay | Admitting: "Endocrinology

## 2018-02-20 ENCOUNTER — Encounter (INDEPENDENT_AMBULATORY_CARE_PROVIDER_SITE_OTHER): Payer: Self-pay | Admitting: "Endocrinology

## 2018-02-20 ENCOUNTER — Ambulatory Visit (INDEPENDENT_AMBULATORY_CARE_PROVIDER_SITE_OTHER): Payer: BLUE CROSS/BLUE SHIELD | Admitting: "Endocrinology

## 2018-02-20 VITALS — BP 118/74 | HR 80 | Ht 69.69 in | Wt 165.0 lb

## 2018-02-20 DIAGNOSIS — R7303 Prediabetes: Secondary | ICD-10-CM | POA: Diagnosis not present

## 2018-02-20 DIAGNOSIS — E063 Autoimmune thyroiditis: Secondary | ICD-10-CM

## 2018-02-20 DIAGNOSIS — R1013 Epigastric pain: Secondary | ICD-10-CM

## 2018-02-20 DIAGNOSIS — E049 Nontoxic goiter, unspecified: Secondary | ICD-10-CM | POA: Diagnosis not present

## 2018-02-20 LAB — POCT GLUCOSE (DEVICE FOR HOME USE): Glucose Fasting, POC: 95 mg/dL (ref 70–99)

## 2018-02-20 LAB — POCT GLYCOSYLATED HEMOGLOBIN (HGB A1C): Hemoglobin A1C: 5.1

## 2018-02-20 NOTE — Progress Notes (Signed)
Subjective:  Patient Name: Jeremy Duncan Date of Birth: December 22, 1995  MRN: [id number]  Jeremy Duncan  presents to the office today for follow-up of his acquired hypothyroidism, Hashimoto's disease, goiter, obesity, hyperlipidemia, hypertension, prediabetes, gynecomastia, dyspepsia, and GERD.  HISTORY OF PRESENT ILLNESS:   Jeremy Duncan is a 22 y.o. Caucasian young man. Jeremy Duncan was unaccompanied.  1. Jeremy Duncan was first referred to me on 10/21/07 by his PCP, Dr. Victorino Dike Summer of [facility], for evaluation and management of hypothyroidism. He was 11 years and 7 months old at the time.  A. The child was the product of an uncomplicated pregnancy. His mother had gestational diabetes mellitus. He was delivered by C-section. Birth weight was 6 lbs. 4 oz. He was a healthy newborn. He was also a healthy infant. Between the ages of 28 and 9 he experienced a marked increase in weight. One year he gained 20 pounds. He also developed hypertension. Laboratory tests on 06/13/07 showed a TSH of 6.09. Repeat studies on 06/17/07 showed a TSH of 6.283. Free T4 was 1.22. Dr. Vaughan Basta called me and I suggested that she start him on Synthroid 25 mcg per day. Thyroid function tests on 07/10/2007 showed that the TSH decreased to 4.055. I suggested that the Synthroid dose be increased to 37.5 mcg per day.   B. The patient's pertinent review of systems revealed that he was always hungry. He had been placed on Prevacid but was only taking it as needed. Previous laboratory tests had shown hypercholesterolemia and hyperinsulinemia. A fasting insulin value was 20 when the accompanying blood glucose was 85. Family history was positive for a maternal great-grandmother who took thyroid pills. It was believed that she had never had surgery or radiation to her neck. That same maternal great-grandmother had had cancer of her breast and bone. Both the father and mother had significant stomach acid issues.There was a family history of  overweight/obesity.   C. On physical examination his height was at the 75th percentile. His weight was at the 97th percentile. BMI was greater than the 97th percentile. His blood pressure was 131/86. He was definitely obese. He had a 12 gm goiter. His right lobe was tender to palpation. Breast tissue was early Tanner stage II. The right areola was 22 mm. The left areola was 25 mm. He had Tanner stage II pubic hair. Testes were to 2-3 mL in volume. Lab tests that day showed a normal CMP. His TSH was 2.773. His free T4 was 1.14. His free T3 was 1.3. FSH was 2.8 and LH 0.4. Testosterone was 25.3. Estradiol was less than 10. Androstenedione was 65.  D. It appeared at that time that the patient had acquired primary hypothyroidism secondary to Hashimoto's disease. His thyroid gland was definitely tender, consistent with a flare-up of Hashimoto's disease at that time. He was obese, in part due to extreme hunger caused by dyspepsia. Family history was definitely positive for dyspepsia and overweight/obesity. Previous labs had shown hyperinsulinemia. Therefore, it was predictable that his testosterone would be slightly elevated for age. I felt that his hypertension was most likely a result of his obesity and that if he could lose enough weight, the hypertension would likely resolve as well. I started the child on metformin, 500 mg twice daily. I discussed with the family our Eat Right diet plan and our Exercise Right plan.  2. During the past 11 years, Jeremy Duncan has lost some additional thyroid cells. As a result, I increased his Synthroid dose to 50 mcg/day. Since then he  has remained euthyroid. During the first year, his weight dropped from the 97th to the 75th percentile, but then gradually rose again to about the 82nd percentile. He had a growth spurt at age 22. When his weight decreased to the 75th percentile, his blood pressure decreased to 111/69. When the weight then increased to the 82nd percentile, the blood  pressure increased to 125/82. His weight percentile peaked at 88% in October 2013, in part due to a large increase in muscle mass. The gynecomastia has gradually resolved over time.   3. The patient's last [facility] visit was on 07/05/17. In the interim, he has been healthy. He lifts weights 3-4 times per week, but does not do much cardio. He does take his Synthroid, 50 mcg once daily most of the time.    4. Pertinent Review of Systems:  Constitutional: Jeremy Duncan feels "pretty good". Energy level is "really good". He is active. Eyes: Vision has improved so he only needs to wear his glasses for distance. There are no other recognized eye problems. Neck: The patient has no complaints of anterior neck swelling, soreness, tenderness, pressure, discomfort, or difficulty swallowing.   Heart: Heart rate increases with exercise or other physical activity. The patient has no complaints of palpitations, irregular heart beats, chest pain, or chest pressure.   Gastrointestinal: He probably has about the same amount of belly hunger that he did at his last visit, but he has not been eating excessively. Bowel movents seem normal. The patient has no complaints of acid reflux, upset stomach, stomach aches or pains, diarrhea, or constipation.  Hands: He texts and plays video games quite well.  Legs: Muscle mass and strength seem normal. There are no complaints of numbness, tingling, burning, or pain. No edema is noted.  Feet: There are no obvious foot problems. There are no complaints of numbness, tingling, burning, or pain. No edema is noted. Neurologic: There are no recognized problems with muscle movement and strength, sensation, or coordination. Breasts: Breast tissue remains about the same or a little bit larger.  Skin: His striae have continued to fade.       PAST MEDICAL, FAMILY, AND SOCIAL HISTORY  Past Medical History:  Diagnosis Date  . Acquired autoimmune hypothyroidism   . Dyspepsia   . GERD  (gastroesophageal reflux disease)   . Goiter   . Gynecomastia, male   . Hyperlipidemia type II   . Hypertension   . Isosexual precocity   . Obesity   . Prediabetes   . Thyroiditis, autoimmune     Family History  Problem Relation Age of Onset  . Diabetes Mother        Gestational diabetes mellitus     Current Outpatient Medications:  .  metFORMIN (GLUCOPHAGE) 500 MG tablet, TAKE 1 TABLET BY MOUTH TWICE A DAY, Disp: 180 tablet, Rfl: 3 .  SYNTHROID 50 MCG tablet, TAKE 1 TABLET BY MOUTH EVERY DAY, Disp: 90 tablet, Rfl: 3   reports that  has never smoked. he has never used smokeless tobacco. Pediatric History  Patient Guardian Status  . Mother:  Rosana HoesSherman,Wanda   Other Topics Concern  . Not on file  Social History Narrative  . Not on file   1. School and Family: The patient is in his junior year at [employer].  He is majoring in Chief Financial Officerinterior architecture.   2. Activities: He lifts weights 3-4 times per week. 3. Primary Care Provider[facility]ean [facility]  REVIEW OF SYSTEMS: There are no other significant problems  involving Jeremy Duncan's other body systems.   Objective:  Vital Signs:  BP 118/74 (BP Location: Left Arm, Patient Position: Sitting, Cuff Size: Large)   Ht 5' 9.69" (1.77 m)   Wt 165 lb (74.8 kg)   BMI 23.89 kg/m    Ht Readings from Last 3 Encounters:  02/20/18 5' 9.69" (1.77 m)  12/28/14 5' 9.21" (1.758 m) (49 %, Z= -0.03)*  08/26/14 5' 9.37" (1.762 m) (53 %, Z= 0.06)*   * Growth percentiles are based on CDC (Boys, 2-20 Years) data.   Wt Readings from Last 3 Encounters:  02/20/18 165 lb (74.8 kg)  07/05/17 170 lb 9.6 oz (77.4 kg)  11/14/16 164 lb (74.4 kg) (64 %, Z= 0.36)*   * Growth percentiles are based on CDC (Boys, 2-20 Years) data.   Body surface area is 1.92 meters squared.  PHYSICAL EXAM:  Constitutional: The patient appears healthy and much older and more mature than his chronologic age. He is definitely quite muscular. His height  growth has ceased. His weight has decreased by 5.5 pounds in 6 months, equivalent to about a net loss of about 100 calories per day. Head: The head is normocephalic. Face: The face appears normal. There are no obvious dysmorphic features. Eyes: The eyes appear to be normally formed and spaced. Gaze is conjugate. There is no obvious arcus or proptosis. Moisture appears normal. Ears: The ears are normally placed and appear externally normal. Mouth: The oropharynx and tongue appear normal. Dentition appears to be normal for age. Oral moisture is normal. Neck: The neck appears to be visibly enlarged. No carotid bruits are noted. The strap muscles are about 2-3 times thicker than they were 3-4 years ago, c/w weight lifting. It is more difficult to assess thyroid gland size now, but his thyroid is probably still enlarged at about 22+ grams in size. The left lobe is again much larger than the right. The consistency of the thyroid gland is fairly firm on the left and normal on the right. The thyroid gland is not tender to palpation. Lungs: The lungs are clear to auscultation. Air movement is good. Heart: Heart rate and rhythm are regular. Heart sounds S1 and S2 are normal. I did not appreciate any pathologic cardiac murmurs. Abdomen: The abdomen is normal in size for the patient's age, but he has more abdominal fat than he had at his last visit. Bowel sounds are normal. There is no obvious hepatomegaly, splenomegaly, or other mass effect.  Arms: Muscle size and bulk are normal for age. Hands: There is no tremor. Phalangeal and metacarpophalangeal joints are normal. Palmar muscles are normal for age. He has no significant palmar erythema. He has no excess palmar moisture.  Legs: Muscles appear normal for age. No edema is present. Neurologic: Strength is normal for age in both the upper and lower extremities. Muscle tone is normal. Sensation to touch is normal in both legs.   Breast tissue: Breasts are Tanner  stage I.1. Areolae measure 28 mm on the right and 29 mm on the left. I do not feel breast buds.  Skin: He has several old, faded, pale striae at his flanks.    LAB DATA:   Labs 02/20/18: HbA1c 5.1%, CBG 95  Labs 07/05/17: HbA1c 4.9%, CBG 159; TSH 1.64, free T4 1.3, free T3 3.6  Labs 11/21/16: TSH 0.79, free T4 1.2, free T3 3.5  Lab s 04/24/16: TSH 3.49, free T4 1.0, free T3 3.4 after missing several Synthroid doses  Labs 10/23/15: TSH 1.657,  free T4 1.20, free T3 3.5  Labs 05/31/15: HbA1c 5.1%; TSH 2.318, free T4 1.26, free T3 3.9  Labs 11/09/14: 24-hour urine free cortisol 3.6, compared with 65.9 three months previously. 24-hour urine creatinine was 2090; TSH 2.836, free T4 1.04, free T3 4.0  Labs 08/22/14: HbA1c 5.6%, compared with 5.1% art last visit and with 4.4% two years ago. TSH 2.675, free T4 1.18, free T3 4.4  Labs 02/24/13: TSH 1.986, free T4 1.17, free T3 3.7   Labs 12/28/11: TSH 2.544, Free T4 1.49, Free T3 4.1   Labs 05/13/11: TSH 1.642. Free T4 1.21. Free T3 4.0.   Assessment and Plan:   ASSESSMENT:  1.  Hypothyroid:   A. The patient was euthyroid in January 2013, in March 2014, in September 2015, and in December 2015, although his TSH had been >2.0. In November 2016 his TFTs were mid-range normal. In May 2017, however, his TSH was elevated after missing many Synthroid doses. I asked him to take the medication daily. His TFTs in December 2017 were at about the 80% of the normal range. His TFTs at his visit in August 2018 were mid-euthyroid. He was supposed to have his TFTs repeated prior to this visit, but did not do so. We will repeat his TFTs today.   B. His treatment TSH goal is between 1.0-2.0.  C. His current Synthroid dose is relatively low for a young man of his age. He will likely lose more thyrocytes over time.  2.  Thyroiditis: His thyroid inflammation is clinically quiescent. The shift of all three TFTs upward or downward together that he has had in the past is  pathognomonic for a flare up of Hashimoto's disease.  3.  Goiter: Thyroid gland is probably a bit smaller in size since his last visit. The process of waxing and waning of the thyroid gland size is consistent with episodes of thyroid inflammation. 4.  Overweight: Jeremy Duncan is muscular. He is not overweight. 5. Hypertension: His BP is good today, but more cardio would help.      6. Dyspepsia: This problem is mildly active, probably due in part to his caffeine intake.     7. Pre-diabetes: His A1c was mid-range normal again today. He is not "pre-diabetic" any longer in a clinical sense. Unless he gains a lot of fat weight there will not be any need to follow this problem.  8. Striae: His striae have essentially resolved.   PLAN:  1. Diagnostic: TFTs today and again in 6 months at his next visit. 2. Therapeutic:  Continue current doses of Synthroid. Try to fit in daily cardio exercise, but at least 3 times per week. I encouraged him to drink more water. If his belly hunger worsens he may need to resume taking ranitidine.  3. Patient education: We talked about the need to continue to eat right and to exercise. We also talked about the natural course of Hashimoto's disease and hypothyroidism. I suspect that he will become progressively more hypothyroid over time. 4. Follow-up: 6 months. Call if having more dyspepsia.  Level of Service: This visit lasted in excess of 45 minutes. More than 50% of the visit was devoted to counseling.  Molli Knock, MD, CDE Adult and Pediatric Endocrinology

## 2018-02-20 NOTE — Patient Instructions (Signed)
Follow up visit in 6 months. 

## 2018-02-21 LAB — TSH: TSH: 1.65 m[IU]/L (ref 0.40–4.50)

## 2018-02-21 LAB — T3, FREE: T3, Free: 3.6 pg/mL (ref 3.0–4.7)

## 2018-02-21 LAB — T4, FREE: Free T4: 1.4 ng/dL (ref 0.8–1.4)

## 2018-02-22 ENCOUNTER — Telehealth (INDEPENDENT_AMBULATORY_CARE_PROVIDER_SITE_OTHER): Payer: Self-pay

## 2018-02-22 NOTE — Telephone Encounter (Addendum)
Left message on [facility] VM tests were wnl if he has questions to call our office----- Message from David StallMichael J Brennan, MD sent at 02/21/2018 11:10 PM EDT ----- Thyroid tests were normal.

## 2018-02-22 NOTE — Telephone Encounter (Deleted)
-----   Message from David StallMichael J Brennan, MD sent at 02/21/2018 11:10 PM EDT ----- Thyroid tests were normal.

## 2018-07-16 DIAGNOSIS — L0591 Pilonidal cyst without abscess: Secondary | ICD-10-CM | POA: Diagnosis not present

## 2018-08-16 DIAGNOSIS — L0591 Pilonidal cyst without abscess: Secondary | ICD-10-CM | POA: Diagnosis not present

## 2018-08-26 ENCOUNTER — Encounter (INDEPENDENT_AMBULATORY_CARE_PROVIDER_SITE_OTHER): Payer: Self-pay | Admitting: "Endocrinology

## 2018-08-26 ENCOUNTER — Ambulatory Visit (INDEPENDENT_AMBULATORY_CARE_PROVIDER_SITE_OTHER): Payer: BLUE CROSS/BLUE SHIELD | Admitting: "Endocrinology

## 2018-08-26 VITALS — BP 118/74 | HR 90 | Ht 69.02 in | Wt 173.0 lb

## 2018-08-26 DIAGNOSIS — E049 Nontoxic goiter, unspecified: Secondary | ICD-10-CM

## 2018-08-26 DIAGNOSIS — E063 Autoimmune thyroiditis: Secondary | ICD-10-CM | POA: Diagnosis not present

## 2018-08-26 DIAGNOSIS — R7303 Prediabetes: Secondary | ICD-10-CM

## 2018-08-26 DIAGNOSIS — I10 Essential (primary) hypertension: Secondary | ICD-10-CM

## 2018-08-26 NOTE — Patient Instructions (Signed)
Follow up visit in 6 months. 

## 2018-08-26 NOTE — Progress Notes (Signed)
Subjective:  Patient Name: Jeremy Duncan Date of Birth: 15-Jun-1996  MRN: [id number]  Jeremy Duncan  presents to the office today for follow-up of his acquired hypothyroidism, Hashimoto's disease, goiter, obesity, hyperlipidemia, hypertension, prediabetes, gynecomastia, dyspepsia, and GERD.  HISTORY OF PRESENT ILLNESS:   Jeremy Duncan is a 22 y.o. Caucasian young man. Jeremy Duncan was unaccompanied.  1. Jeremy Duncan was first referred to me on 10/21/07 by his PCP, Jeremy Duncan of [facility], for evaluation and management of hypothyroidism. He was 11 years and 7 months old at the time.  A. The child was the product of an uncomplicated pregnancy. His mother had gestational diabetes mellitus. He was delivered by C-section. Birth weight was 6 lbs. 4 oz. He was a healthy newborn. He was also a healthy infant. Between the ages of 25 and 9 he experienced a marked increase in weight. One year he gained 20 pounds. He also developed hypertension. Laboratory tests on 06/13/07 showed a TSH of 6.09. Repeat studies on 06/17/07 showed a TSH of 6.283. Free T4 was 1.22. Jeremy Duncan called me and I suggested that she start him on Synthroid 25 mcg per day. Thyroid function tests on 07/10/2007 showed that the TSH decreased to 4.055. I suggested that the Synthroid dose be increased to 37.5 mcg per day.   B. The patient's pertinent review of systems revealed that he was always hungry. He had been placed on Prevacid but was only taking it as needed. Previous laboratory tests had shown hypercholesterolemia and hyperinsulinemia. A fasting insulin value was 20 when the accompanying blood glucose was 85. Family history was positive for a maternal great-grandmother who took thyroid pills. It was believed that she had never had surgery or radiation to her neck. That same maternal great-grandmother had had cancer of her breast and bone. Both the father and mother had significant stomach acid issues.There was a family history of  overweight/obesity. [Addendum 08/26/18: There is a history of high BP on his mom's side of the family.]  C. On physical examination his height was at the 75th percentile. His weight was at the 97th percentile. BMI was greater than the 97th percentile. His blood pressure was 131/86. He was definitely obese. He had a 12 gm goiter. His right lobe was tender to palpation. Breast tissue was early Tanner stage II. The right areola was 22 mm. The left areola was 25 mm. He had Tanner stage II pubic hair. Testes were to 2-3 mL in volume. Lab tests that day showed a normal CMP. His TSH was 2.773. His free T4 was 1.14. His free T3 was 1.3. FSH was 2.8 and LH 0.4. Testosterone was 25.3. Estradiol was less than 10. Androstenedione was 65.  D. It appeared at that time that the patient had acquired primary hypothyroidism secondary to Hashimoto's disease. His thyroid gland was definitely tender, consistent with a flare-up of Hashimoto's disease at that time. He was obese, in part due to extreme hunger caused by dyspepsia. Family history was definitely positive for dyspepsia and overweight/obesity. Previous labs had shown hyperinsulinemia. Therefore, it was predictable that his testosterone would be slightly elevated for age. I felt that his hypertension was most likely a result of his obesity and that if he could lose enough weight, the hypertension would likely resolve as well. I started the child on metformin, 500 mg twice daily. I discussed with the family our Eat Right diet plan and our Exercise Right plan.  2. During the past 11 years, Jeremy Duncan has lost some additional thyroid  cells. As a result, I increased his Synthroid dose to 50 mcg/day. Since then he has remained euthyroid. During the first year, his weight dropped from the 97th to the 75th percentile, but then gradually rose again to about the 82nd percentile. He had a growth spurt at age 52. When his weight decreased to the 75th percentile, his blood pressure decreased  to 111/69. When the weight then increased to the 82nd percentile, the blood pressure increased to 125/82. His weight percentile peaked at 88% in October 2013, in part due to a large increase in muscle mass. The gynecomastia has gradually resolved over time.   3. The patient's last [facility] visit was on 02/20/18. In the interim, he has been healthy. He lifts weights 3 times per week, but does not do much cardio. He does take his Synthroid, 50 mcg once daily almost every day.     4. Pertinent Review of Systems:  Constitutional: [name] feels "pretty good". Energy level is "higher than it has been in along time". He is active. Eyes: Vision has improved so he only needs to wear his glasses for distance. There are no other recognized eye problems. Neck: The patient has no complaints of anterior neck swelling, soreness, tenderness, pressure, discomfort, or difficulty swallowing.   Heart: Heart rate increases with exercise or other physical activity. The patient has no complaints of palpitations, irregular heart beats, chest pain, or chest pressure.   Gastrointestinal: He probably has more belly hunger that he did at his last visit, but not excessively so. Bowel movents seem normal. The patient has no complaints of acid reflux, upset stomach, stomach aches or pains, diarrhea, or constipation.  Hands: He texts and plays video games quite well.  Legs: Muscle mass and strength seem normal. There are no complaints of numbness, tingling, burning, or pain. No edema is noted.  Feet: There are no obvious foot problems. There are no complaints of numbness, tingling, burning, or pain. No edema is noted. Neurologic: There are no recognized problems with muscle movement and strength, sensation, or coordination. Breasts: Breast tissue remains about the same.  Skin: His striae have continued to fade.       PAST MEDICAL, FAMILY, AND SOCIAL HISTORY  Past Medical History:  Diagnosis Date  . Acquired autoimmune hypothyroidism    . Dyspepsia   . GERD (gastroesophageal reflux disease)   . Goiter   . Gynecomastia, male   . Hyperlipidemia type II   . Hypertension   . Isosexual precocity   . Obesity   . Prediabetes   . Thyroiditis, autoimmune     Family History  Problem Relation Age of Onset  . Diabetes Mother        Gestational diabetes mellitus     Current Outpatient Medications:  .  SYNTHROID 50 MCG tablet, TAKE 1 TABLET BY MOUTH EVERY DAY, Disp: 90 tablet, Rfl: 3   reports that he has never smoked. He has never used smokeless tobacco. Pediatric History  Patient Guardian Status  . Mother:  Jeremy Duncan, Jeremy Duncan   Other Topics Concern  . Not on file  Social History Narrative  . Not on file   1. School and Family: The patient is in his senior year at [employer].  He is majoring in Chief Financial Officer.   2. Activities: He lifts weights 3 times per week, but does not do any cardio. 3. Primary Care Provider[facility] Lu[facility]  REVIEW OF SYSTEMS: There are no other significant problems involving Jeremy Duncan's other body systems.  Objective:  Vital Signs:  BP 118/74   Pulse 90   Ht 5' 9.02" (1.753 m)   Wt 173 lb (78.5 kg)   BMI 25.54 kg/m    Ht Readings from Last 3 Encounters:  08/26/18 5' 9.02" (1.753 m)  02/20/18 5' 9.69" (1.77 m)  12/28/14 5' 9.21" (1.758 m) (49 %, Z= -0.03)*   * Growth percentiles are based on CDC (Boys, 2-20 Years) data.   Wt Readings from Last 3 Encounters:  08/26/18 173 lb (78.5 kg)  02/20/18 165 lb (74.8 kg)  07/05/17 170 lb 9.6 oz (77.4 kg)   Body surface area is 1.96 meters squared.  PHYSICAL EXAM:  Waist circumference at the umbilicus: 85 cm = 33.5 inches.  Constitutional: The patient appears healthy and much older and more mature than his chronologic age. He is definitely quite muscular. His height growth has ceased. His weight has increased by 8 pounds in 6 months, which he attributes to lifting weight much more intensively.   Head: The head is  normocephalic. Face: The face appears normal. There are no obvious dysmorphic features. Eyes: The eyes appear to be normally formed and spaced. Gaze is conjugate. There is no obvious arcus or proptosis. Moisture appears normal. Ears: The ears are normally placed and appear externally normal. Mouth: The oropharynx and tongue appear normal. Dentition appears to be normal for age. Oral moisture is normal. Neck: The neck is visibly enlarged. No carotid bruits are noted. The strap muscles are about 2-3 times thicker than they were 4-5 years ago, c/w weight lifting. It is more difficult to assess his thyroid gland size now, but his thyroid is probably still enlarged at about 22+ grams in size. The isthmus is mildly enlarged today. The lobes are symmetrically enlarged.  The consistency of the thyroid gland is normal. The thyroid gland is not tender to palpation. Lungs: The lungs are clear to auscultation. Air movement is good. Heart: Heart rate and rhythm are regular. Heart sounds S1 and S2 are normal. He has grade 2/6 systolic flow murmur that sounds benign. I did not appreciate any pathologic cardiac murmurs. Abdomen: The abdomen is normal in size for the patient's age. He probably has less abdominal fat than at his last visit. Bowel sounds are normal. There is no obvious hepatomegaly, splenomegaly, or other mass effect.  Arms: Muscle size and bulk are normal for age. Hands: There is a 1+ tremor. Phalangeal and metacarpophalangeal joints are normal. Palmar muscles are normal for age. He has no palmar erythema. He has no excess palmar moisture.  Legs: Muscles appear normal for age. No edema is present. Neurologic: Strength is normal for age in both the upper and lower extremities. Muscle tone is normal. Sensation to touch is normal in both legs.   Breast tissue: Breasts are Tanner stage I.1. Areolae measure 25 mm on the right and 28 mm on the left, compared with 28 mm on the right and 29 mm on the left at his  last visit. I do not feel breast buds.  Skin: He has several old, faded, pale striae at his flanks.    LAB DATA:   Labs 02/20/18: HbA1c 5.1%, CBG 95; TSH 1.65, free T4 1.4, free T3 3.6  Labs 07/05/17: HbA1c 4.9%, CBG 159; TSH 1.64, free T4 1.3, free T3 3.6  Labs 11/21/16: TSH 0.79, free T4 1.2, free T3 3.5  Lab s 04/24/16: TSH 3.49, free T4 1.0, free T3 3.4 after missing several Synthroid doses  Labs 10/23/15: TSH  1.657, free T4 1.20, free T3 3.5  Labs 05/31/15: HbA1c 5.1%; TSH 2.318, free T4 1.26, free T3 3.9  Labs 11/09/14: 24-hour urine free cortisol 3.6, compared with 65.9 three months previously. 24-hour urine creatinine was 2090; TSH 2.836, free T4 1.04, free T3 4.0  Labs 08/22/14: HbA1c 5.6%, compared with 5.1% art last visit and with 4.4% two years ago. TSH 2.675, free T4 1.18, free T3 4.4  Labs 02/24/13: TSH 1.986, free T4 1.17, free T3 3.7   Labs 12/28/11: TSH 2.544, Free T4 1.49, Free T3 4.1   Labs 05/13/11: TSH 1.642. Free T4 1.21. Free T3 4.0.   Assessment and Plan:   ASSESSMENT:  1.  Hypothyroid:   A. The patient was euthyroid in January 2013, in March 2014, in September 2015, and in December 2015, although his TSH had been >2.0. In November 2016 his TFTs were mid-range normal. In May 2017, however, his TSH was elevated after missing many Synthroid doses. I asked him to take the medication daily. His TFTs in December 2017 were at about the 80% of the normal range. His TFTs at his visit in August 2018 and in March 2019 were mid-euthyroid. He was supposed to have his TFTs repeated prior to this visit, but did not do so. We will repeat his TFTs today.   B. His treatment TSH goal is between 1.0-2.0.  C. His current Synthroid dose is relatively low for a young man of his age. He will likely lose more thyrocytes over time.  2.  Thyroiditis: His thyroid inflammation is clinically quiescent. The shift of all three TFTs upward or downward together that he has had in the past is  pathognomonic for a flare up of Hashimoto's disease.  3.  Goiter: Thyroid gland is probably about the same size as it was at his last visit, or perhaps a little smaller. The process of waxing and waning of the thyroid gland size is consistent with episodes of thyroid inflammation. 4. Overweight: Yurem is muscular. He is not overweight. 5. Hypertension: His SBP is good today, but his DBP is still a bit high.    6. Dyspepsia: This problem is mildly active, but not significant.      7. Pre-diabetes: His A1c was mid-range normal in March 2019. He is not "pre-diabetic" any longer in a clinical sense. Unless he gains a lot of fat weight there will not be any need to follow this problem.  8. Striae: His striae have essentially resolved.   PLAN:  1. Diagnostic: TFTs and HbA1c today and again in 6 months, or 12 months, at his next visit. 2. Therapeutic:  Continue current doses of Synthroid. Try to fit in daily cardio exercise, but at least 3 times per week. I encouraged him to drink more water. If his belly hunger worsens he may need to resume taking ranitidine.  3. Patient education: We talked about the need to continue to eat right and to exercise. We also talked about the natural course of Hashimoto's disease and hypothyroidism. I suspect that he will become progressively more hypothyroid over time. Given his family history of hypertension it would be prudent to do more cardio.  4. Follow-up: 6 months, or 12 months if today's TFTS are still mid-euthyroid. Call if having more dyspepsia.  Level of Service: This visit lasted in excess of 45 minutes. More than 50% of the visit was devoted to counseling.  Molli Knock, MD, CDE Adult and Pediatric Endocrinology

## 2018-08-27 LAB — T3, FREE: T3 FREE: 4.2 pg/mL (ref 2.3–4.2)

## 2018-08-27 LAB — TSH: TSH: 1.42 m[IU]/L (ref 0.40–4.50)

## 2018-08-27 LAB — HEMOGLOBIN A1C
EAG (MMOL/L): 5.5 (calc)
Hgb A1c MFr Bld: 5.1 % of total Hgb (ref <5.7)
MEAN PLASMA GLUCOSE: 100 (calc)

## 2018-08-27 LAB — T4, FREE: FREE T4: 1.3 ng/dL (ref 0.8–1.8)

## 2018-08-29 ENCOUNTER — Encounter (INDEPENDENT_AMBULATORY_CARE_PROVIDER_SITE_OTHER): Payer: Self-pay | Admitting: *Deleted

## 2018-10-11 ENCOUNTER — Ambulatory Visit: Payer: Self-pay | Admitting: Surgery

## 2018-10-11 DIAGNOSIS — L0591 Pilonidal cyst without abscess: Secondary | ICD-10-CM | POA: Diagnosis not present

## 2018-10-11 NOTE — H&P (Signed)
Jeremy Duncan Documented: 10/11/2018 10:04 AM Location: [facility] Patient #: [id number] DOB: 1996-12-01 Single / Language: [place] / Race: White Male  History of Present Illness Jeremy Fus A. Jeylin Woodmansee MD; 10/11/2018 11:21 AM) Patient words: Patient returns for recheck of his pilonidal cyst disease. As been draining off and on but no recent infections. He does have pain and it does cause mild swelling of his clothing.  The patient is a 22 year old male.   Allergies (Tanisha A. Manson Passey, RMA; 10/11/2018 10:05 AM) Penicillins Amoxicillin *PENICILLINS* Allergies Reconciled  Medication History (Tanisha A. Manson Passey, RMA; 10/11/2018 10:05 AM) Synthroid ( Tablet, Oral) Active. Medications Reconciled    Vitals (Tanisha A. Brown RMA; 10/11/2018 10:05 AM) 10/11/2018 10:04 AM Weight: 177.6 lb Height: 70in Body Surface Area: 1.98 m Body Mass Index: 25.48 kg/m  Temp.: 98.51F  Pulse: 98 (Regular)  BP: 122/76 (Sitting, Left Arm, Standard)      Physical Exam (Karmah Potocki A. Ryott Rafferty MD; 10/11/2018 11:22 AM)  General Mental Status-Alert. General Appearance-Consistent with stated age. Hydration-Well hydrated. Voice-Normal.  Chest and Lung Exam Chest and lung exam reveals -quiet, even and easy respiratory effort with no use of accessory muscles and on auscultation, normal breath sounds, no adventitious sounds and normal vocal resonance. Inspection Chest Wall - Normal. Back - normal.  Cardiovascular Cardiovascular examination reveals -normal heart sounds, regular rate and rhythm with no murmurs and normal pedal pulses bilaterally.  Rectal Note: Complex area of pilonidal disease measuring 5 cm x 3 cm. 2 open draining sinus tracts but no signs of redness or fluctuance today.  Neurologic Neurologic evaluation reveals -alert and oriented x 3 with no impairment of recent or remote memory. Mental Status-Normal.  Musculoskeletal Normal Exam - Left-Upper  Extremity Strength Normal and Lower Extremity Strength Normal. Normal Exam - Right-Upper Extremity Strength Normal and Lower Extremity Strength Normal.    Assessment & Plan (Javien Tesch A. Cristobal Advani MD; 10/11/2018 10:40 AM)  PILONIDAL CYST (L05.91) Impression: ready to schedule excision next month  Current Plans The anatomy of the intragluteal cleft was discussed. Pathophysiology of pilonidal disease was discussed. The importance of keeping hairs trimmed to avoid recurrence was discussed. Discussion of options such as curretage, excision with closure vs leaving open was discussed. Risks of infection with need for incision and drainage & antibiotics were discussed. I noted a good likelihood this will help address the problem.  At this point, I think the patient would best served with considering surgery to excise the diseased tissue. I will make an attempt to close but it may need to be left open to allow it to heal with secondary intention and wound packing. Possible recurrences need reoperation or different techniques were discussed as well. I noted that recurrence is higher with poor compliance on hair removal hygiene & overall health. The patient's questions were answered. The patient agrees to proceed.   PILONIDAL CYST WITHOUT INFECTION (L05.91)  Current Plans The anatomy of the gluteal and sacral region was discussed. Pathophysiology of pilonidal disease due to ingrown hairs was discussed. Importance of good hygiene discussed. Importance of keeping hairs trimmed in the intergluteal crease from anus to top of sacrum/tailbone spine noted. Need for good hygiene stressed. Use of electric razor or nose hair trimmers to keep the hairs trimmed discussed.  Risk of worsening progression needing surgical drainage of abscess or perhaps excision of chronic pilonidal disease with skin flap closure over drains discussed. Possible need for her to leave it open with packing to allow the wound close over  several weeks/months discussed. Risks benefits alternatives to surgery discussed as well. Risk of recurrent disease discussed. Patient was expressed understanding. Literature given to patient. We will see if we can control this without an operation first.  Pt Education - [facility] Pilonidal Disease (AT) Pt Education - [facility] Pilonidal Disease (AT) Pt Education - [facility] Pilonidal Surgery HCI - post op instructions: discussed with patient and provided information. The anatomy of the intragluteal cleft was discussed. Pathophysiology of pilonidal disease was discussed. The importance of keeping hairs trimmed to avoid recurrence was discussed. Discussion of options such as curretage, excision with closure vs leaving open was discussed. Risks of infection with need for incision and drainage & antibiotics were discussed. I noted a good likelihood this will help address the problem.  At this point, I think the patient would best served with considering surgery to excise the diseased tissue. I will make an attempt to close but it may need to be left open to allow it to heal with secondary intention and wound packing. Possible recurrences need reoperation or different techniques were discussed as well. I noted that recurrence is higher with poor compliance on hair removal hygiene & overall health. The patient's questions were answered. The patient agrees to proceed.  Pt Education - [facility] Pilonidal Disease (AT) The anatomy of the gluteal and sacral region was discussed. Pathophysiology of pilonidal disease due to ingrown hairs was discussed. Importance of good hygiene discussed. Importance of keeping hairs trimmed in the intergluteal crease from anus to top of sacrum/tailbone spine noted. Need for good hygiene stressed. Use of electric razor or nose hair trimmers to keep the hairs trimmed discussed.  Risk of worsening progression needing surgical drainage of abscess or perhaps excision of chronic pilonidal disease  with skin flap closure over drains discussed. Possible need for her to leave it open with packing to allow the wound close over several weeks/months discussed. Risks benefits alternatives to surgery discussed as well. Risk of recurrent disease discussed. Patient was expressed understanding. Literature given to patient. We will see if we can control this without an operation first.

## 2018-10-12 DIAGNOSIS — Z23 Encounter for immunization: Secondary | ICD-10-CM | POA: Diagnosis not present

## 2018-11-10 ENCOUNTER — Other Ambulatory Visit (INDEPENDENT_AMBULATORY_CARE_PROVIDER_SITE_OTHER): Payer: Self-pay | Admitting: "Endocrinology

## 2018-11-10 DIAGNOSIS — E034 Atrophy of thyroid (acquired): Secondary | ICD-10-CM

## 2018-11-12 ENCOUNTER — Encounter (HOSPITAL_COMMUNITY): Payer: Self-pay

## 2018-11-12 NOTE — Pre-Procedure Instructions (Signed)
Jeremy Duncan  11/12/2018      [id number] - [place], [place] - [address] [place] [id number] Phone: [phone] Fax: [phone]    Your procedure is scheduled on December 19th.  [facility].M.  Call this number if you have problems the morning of surgery:  [phone]   Remember:  Do not eat after midnight.  You may drink clear liquids until 0430am .  Clear liquids allowed are:                    Water, Juice (non-citric and without pulp), Carbonated beverages, Clear Tea, Black Coffee only and Gatorade    Take these medicines the morning of surgery with A SIP OF WATER  SYNTHROID  7 days prior to surgery STOP taking any Aspirin(unless otherwise instructed by your surgeon), Aleve, Naproxen, Ibuprofen, Motrin, Advil, Goody's, BC's, all herbal medications, fish oil, and all vitamins     Do not wear jewelry.  Do not wear lotions, powders, or colognes, or deodorant.  Men may shave face and neck.  Do not bring valuables to the hospital.  [facility] is not responsible for any belongings or valuables.  Contacts, dentures or bridgework may not be worn into surgery.  Leave your suitcase in the car.  After surgery it may be brought to your room.  For patients admitted to the hospital, discharge time will be determined by your treatment team.  Patients discharged the day of surgery will not be allowed to drive home.    [place]- Preparing For Surgery  Before surgery, you can play an important role. Because skin is not sterile, your skin needs to be as free of germs as possible. You can reduce the number of germs on your skin by washing with CHG (chlorahexidine gluconate) Soap before surgery.  CHG is an antiseptic cleaner which kills germs and bonds with the skin to continue killing germs even after washing.    Oral Hygiene is also important to reduce your risk  of infection.  Remember - BRUSH YOUR TEETH THE MORNING OF SURGERY WITH YOUR REGULAR TOOTHPASTE  Please do not use if you have an allergy to CHG or antibacterial soaps. If your skin becomes reddened/irritated stop using the CHG.  Do not shave (including legs and underarms) for at least 48 hours prior to first CHG shower. It is OK to shave your face.  Please follow these instructions carefully.   1. Shower the NIGHT BEFORE SURGERY and the MORNING OF SURGERY with CHG.   2. If you chose to wash your hair, wash your hair first as usual with your normal shampoo.  3. After you shampoo, rinse your hair and body thoroughly to remove the shampoo.  4. Use CHG as you would any other liquid soap. You can apply CHG directly to the skin and wash gently with a scrungie or a clean washcloth.   5. Apply the CHG Soap to your body ONLY FROM THE NECK DOWN.  Do not use on open wounds or open sores. Avoid contact with your eyes, ears, mouth and genitals (private parts). Wash Face and genitals (private parts)  with your normal soap.  6. Wash thoroughly, paying special attention to the area where your surgery will be performed.  7. Thoroughly rinse your body with warm water from the neck down.  8. DO NOT shower/wash with  your normal soap after using and rinsing off the CHG Soap.  9. Pat yourself dry with a CLEAN TOWEL.  10. Wear CLEAN PAJAMAS to bed the night before surgery, wear comfortable clothes the morning of surgery  11. Place CLEAN SHEETS on your bed the night of your first shower and DO NOT SLEEP WITH PETS.    Day of Surgery:  Do not apply any deodorants/lotions.  Please wear clean clothes to the hospital/surgery center.   Remember to brush your teeth WITH YOUR REGULAR TOOTHPASTE.    Please read over the following fact sheets that you were given.

## 2018-11-13 ENCOUNTER — Encounter (HOSPITAL_COMMUNITY)
Admission: RE | Admit: 2018-11-13 | Discharge: 2018-11-13 | Disposition: A | Discharge status: Home / Self Care | Payer: BLUE CROSS/BLUE SHIELD | Source: Ambulatory Visit | Attending: Surgery | Admitting: Surgery

## 2018-11-13 ENCOUNTER — Other Ambulatory Visit: Payer: Self-pay

## 2018-11-13 ENCOUNTER — Encounter (HOSPITAL_COMMUNITY): Payer: Self-pay

## 2018-11-13 DIAGNOSIS — Z01812 Encounter for preprocedural laboratory examination: Secondary | ICD-10-CM | POA: Diagnosis not present

## 2018-11-13 HISTORY — DX: Cardiac murmur, unspecified: R01.1

## 2018-11-13 LAB — BASIC METABOLIC PANEL
Anion gap: 10 (ref 5–15)
BUN: 17 mg/dL (ref 6–20)
CO2: 28 mmol/L (ref 22–32)
Calcium: 9.7 mg/dL (ref 8.9–10.3)
Chloride: 103 mmol/L (ref 98–111)
Creatinine, Ser: 1.05 mg/dL (ref 0.61–1.24)
GFR calc Af Amer: >60 mL/min (ref >60)
GFR calc non Af Amer: >60 mL/min (ref >60)
Glucose, Bld: 93 mg/dL (ref 70–99)
Potassium: 4 mmol/L (ref 3.5–5.1)
Sodium: 141 mmol/L (ref 135–145)

## 2018-11-13 LAB — HEMOGLOBIN: Hemoglobin: 16.3 g/dL (ref 13.0–17.0)

## 2018-11-13 NOTE — Progress Notes (Signed)
PCP - Lupe Carneyean Mitchell Cardiologist - denies Endocrinologist- Dr. Leeann MustBrannen- pt is no longer pre-diabetic.   I spoke with Anesthesia APP Debroah BallerAllison and James at pre-op appt. No need for EKG as of today. Pt stated that his BP was "a little high", but does not take any medicine for this.   Patient denies shortness of breath, fever, cough and chest pain at PAT appointment   Patient verbalized understanding of instructions that were given to them at the PAT appointment. Patient was also instructed that they will need to review over the PAT instructions again at home before surgery.

## 2018-11-20 ENCOUNTER — Encounter (HOSPITAL_COMMUNITY): Payer: Self-pay | Admitting: Anesthesiology

## 2018-11-20 NOTE — Anesthesia Preprocedure Evaluation (Addendum)
Anesthesia Evaluation  Patient identified by MRN, date of birth, ID band Patient awake    Reviewed: Allergy & Precautions, NPO status , Patient's Chart, lab work & pertinent test results  Airway Mallampati: I       Dental  (+) Teeth Intact   Pulmonary    Pulmonary exam normal breath sounds clear to auscultation       Cardiovascular hypertension, Normal cardiovascular exam Rhythm:Regular Rate:Normal     Neuro/Psych    GI/Hepatic   Endo/Other  Hypothyroidism   Renal/GU      Musculoskeletal   Abdominal Normal abdominal exam  (+)   Peds  Hematology   Anesthesia Other Findings   Reproductive/Obstetrics                            Anesthesia Physical Anesthesia Plan  ASA: II  Anesthesia Plan: General   Post-op Pain Management:    Induction: Intravenous  PONV Risk Score and Plan: 3 and Ondansetron, Dexamethasone, Midazolam and Scopolamine patch - Pre-op  Airway Management Planned: Oral ETT  Additional Equipment:   Intra-op Plan:   Post-operative Plan: Extubation in OR  Informed Consent: I have reviewed the patients History and Physical, chart, labs and discussed the procedure including the risks, benefits and alternatives for the proposed anesthesia with the patient or authorized representative who has indicated his/her understanding and acceptance.   Dental advisory given  Plan Discussed with: CRNA  Anesthesia Plan Comments:      Anesthesia Quick Evaluation

## 2018-11-21 ENCOUNTER — Ambulatory Visit (HOSPITAL_COMMUNITY): Payer: BLUE CROSS/BLUE SHIELD | Admitting: Vascular Surgery

## 2018-11-21 ENCOUNTER — Encounter (HOSPITAL_COMMUNITY): Payer: Self-pay | Admitting: *Deleted

## 2018-11-21 ENCOUNTER — Encounter (HOSPITAL_COMMUNITY)
Admission: RE | Disposition: A | Discharge status: Home / Self Care | Payer: Self-pay | Source: Home / Self Care | Attending: Surgery

## 2018-11-21 ENCOUNTER — Ambulatory Visit (HOSPITAL_COMMUNITY): Payer: BLUE CROSS/BLUE SHIELD | Admitting: Anesthesiology

## 2018-11-21 ENCOUNTER — Ambulatory Visit (HOSPITAL_COMMUNITY)
Admission: RE | Admit: 2018-11-21 | Discharge: 2018-11-21 | Disposition: A | Discharge status: Home / Self Care | Payer: BLUE CROSS/BLUE SHIELD | Attending: Surgery | Admitting: Surgery

## 2018-11-21 DIAGNOSIS — I10 Essential (primary) hypertension: Secondary | ICD-10-CM | POA: Diagnosis not present

## 2018-11-21 DIAGNOSIS — L0591 Pilonidal cyst without abscess: Secondary | ICD-10-CM | POA: Diagnosis not present

## 2018-11-21 DIAGNOSIS — E785 Hyperlipidemia, unspecified: Secondary | ICD-10-CM | POA: Diagnosis not present

## 2018-11-21 DIAGNOSIS — E7800 Pure hypercholesterolemia, unspecified: Secondary | ICD-10-CM | POA: Diagnosis not present

## 2018-11-21 DIAGNOSIS — Z880 Allergy status to penicillin: Secondary | ICD-10-CM | POA: Insufficient documentation

## 2018-11-21 HISTORY — PX: PILONIDAL CYST EXCISION: SHX744

## 2018-11-21 SURGERY — EXCISION, SIMPLE PILONIDAL CYST
Anesthesia: General

## 2018-11-21 MED ORDER — SUGAMMADEX SODIUM 200 MG/2ML IV SOLN
INTRAVENOUS | Status: DC | PRN
  Administered 2018-11-21: 200 mg via INTRAVENOUS

## 2018-11-21 MED ORDER — PROPOFOL 10 MG/ML IV BOLUS
INTRAVENOUS | Status: AC
  Filled 2018-11-21: qty 40

## 2018-11-21 MED ORDER — MEPERIDINE HCL 50 MG/ML IJ SOLN: 6.2500 mg | INTRAMUSCULAR | Status: DC | PRN

## 2018-11-21 MED ORDER — SCOPOLAMINE 1 MG/3DAYS TD PT72
MEDICATED_PATCH | TRANSDERMAL | Status: AC
  Filled 2018-11-21: qty 1

## 2018-11-21 MED ORDER — IBUPROFEN 100 MG/5ML PO SUSP: 200.0000 mg | Freq: Four times a day (QID) | ORAL | Status: DC | PRN

## 2018-11-21 MED ORDER — FENTANYL CITRATE (PF) 100 MCG/2ML IJ SOLN
INTRAMUSCULAR | Status: DC | PRN
  Administered 2018-11-21: 100 ug via INTRAVENOUS
  Administered 2018-11-21: 150 ug via INTRAVENOUS

## 2018-11-21 MED ORDER — ACETAMINOPHEN 500 MG PO TABS
1000.0000 mg | ORAL_TABLET | ORAL | Status: AC
  Administered 2018-11-21: 1000 mg via ORAL
  Filled 2018-11-21: qty 2

## 2018-11-21 MED ORDER — ONDANSETRON HCL 4 MG/2ML IJ SOLN
INTRAMUSCULAR | Status: AC
  Filled 2018-11-21: qty 2

## 2018-11-21 MED ORDER — IBUPROFEN 800 MG PO TABS: 800.0000 mg | ORAL_TABLET | Freq: Three times a day (TID) | ORAL | 0 refills | Status: AC | PRN

## 2018-11-21 MED ORDER — KETOROLAC TROMETHAMINE 30 MG/ML IJ SOLN: 30.0000 mg | Freq: Once | INTRAMUSCULAR | Status: DC | PRN

## 2018-11-21 MED ORDER — BUPIVACAINE-EPINEPHRINE 0.25% -1:200000 IJ SOLN
INTRAMUSCULAR | Status: DC | PRN
  Administered 2018-11-21: 10 mL

## 2018-11-21 MED ORDER — CHLORHEXIDINE GLUCONATE CLOTH 2 % EX PADS: 6.0000 | MEDICATED_PAD | Freq: Once | CUTANEOUS | Status: DC

## 2018-11-21 MED ORDER — DEXAMETHASONE SODIUM PHOSPHATE 10 MG/ML IJ SOLN
INTRAMUSCULAR | Status: DC | PRN
  Administered 2018-11-21: 10 mg via INTRAVENOUS

## 2018-11-21 MED ORDER — CELECOXIB 200 MG PO CAPS
200.0000 mg | ORAL_CAPSULE | ORAL | Status: AC
  Administered 2018-11-21: 200 mg via ORAL
  Filled 2018-11-21: qty 1

## 2018-11-21 MED ORDER — HYDROCODONE-ACETAMINOPHEN 5-325 MG PO TABS: 1.0000 | ORAL_TABLET | Freq: Four times a day (QID) | ORAL | 0 refills | Status: DC | PRN

## 2018-11-21 MED ORDER — OXYCODONE HCL 5 MG/5ML PO SOLN: 5.0000 mg | Freq: Once | ORAL | Status: DC | PRN

## 2018-11-21 MED ORDER — IBUPROFEN 200 MG PO TABS: 200.0000 mg | ORAL_TABLET | Freq: Four times a day (QID) | ORAL | Status: DC | PRN

## 2018-11-21 MED ORDER — LACTATED RINGERS IV SOLN
INTRAVENOUS | Status: DC | PRN
  Administered 2018-11-21 (×2): via INTRAVENOUS

## 2018-11-21 MED ORDER — DEXAMETHASONE SODIUM PHOSPHATE 10 MG/ML IJ SOLN
INTRAMUSCULAR | Status: AC
  Filled 2018-11-21: qty 1

## 2018-11-21 MED ORDER — CLINDAMYCIN PHOSPHATE 900 MG/50ML IV SOLN
900.0000 mg | INTRAVENOUS | Status: AC
  Administered 2018-11-21: 900 mg via INTRAVENOUS
  Filled 2018-11-21: qty 50

## 2018-11-21 MED ORDER — FENTANYL CITRATE (PF) 100 MCG/2ML IJ SOLN: 25.0000 ug | INTRAMUSCULAR | Status: DC | PRN

## 2018-11-21 MED ORDER — GABAPENTIN 300 MG PO CAPS
300.0000 mg | ORAL_CAPSULE | ORAL | Status: AC
  Administered 2018-11-21: 300 mg via ORAL
  Filled 2018-11-21: qty 1

## 2018-11-21 MED ORDER — ROCURONIUM BROMIDE 50 MG/5ML IV SOSY
PREFILLED_SYRINGE | INTRAVENOUS | Status: AC
  Filled 2018-11-21: qty 5

## 2018-11-21 MED ORDER — ONDANSETRON HCL 4 MG/2ML IJ SOLN: 4.0000 mg | Freq: Once | INTRAMUSCULAR | Status: DC | PRN

## 2018-11-21 MED ORDER — MIDAZOLAM HCL 2 MG/2ML IJ SOLN
INTRAMUSCULAR | Status: AC
  Filled 2018-11-21: qty 2

## 2018-11-21 MED ORDER — LIDOCAINE 2% (20 MG/ML) 5 ML SYRINGE
INTRAMUSCULAR | Status: AC
  Filled 2018-11-21: qty 5

## 2018-11-21 MED ORDER — FENTANYL CITRATE (PF) 250 MCG/5ML IJ SOLN
INTRAMUSCULAR | Status: AC
  Filled 2018-11-21: qty 5

## 2018-11-21 MED ORDER — BUPIVACAINE-EPINEPHRINE (PF) 0.25% -1:200000 IJ SOLN
INTRAMUSCULAR | Status: AC
  Filled 2018-11-21: qty 30

## 2018-11-21 MED ORDER — 0.9 % SODIUM CHLORIDE (POUR BTL) OPTIME
TOPICAL | Status: DC | PRN
  Administered 2018-11-21: 1000 mL

## 2018-11-21 MED ORDER — ONDANSETRON HCL 4 MG/2ML IJ SOLN
INTRAMUSCULAR | Status: DC | PRN
  Administered 2018-11-21: 4 mg via INTRAVENOUS

## 2018-11-21 MED ORDER — LIDOCAINE 2% (20 MG/ML) 5 ML SYRINGE
INTRAMUSCULAR | Status: DC | PRN
  Administered 2018-11-21: 100 mg via INTRAVENOUS

## 2018-11-21 MED ORDER — OXYCODONE HCL 5 MG PO TABS: 5.0000 mg | ORAL_TABLET | Freq: Once | ORAL | Status: DC | PRN

## 2018-11-21 MED ORDER — PROPOFOL 10 MG/ML IV BOLUS
INTRAVENOUS | Status: DC | PRN
  Administered 2018-11-21: 160 mg via INTRAVENOUS

## 2018-11-21 MED ORDER — ROCURONIUM BROMIDE 10 MG/ML (PF) SYRINGE
PREFILLED_SYRINGE | INTRAVENOUS | Status: DC | PRN
  Administered 2018-11-21: 40 mg via INTRAVENOUS

## 2018-11-21 MED ORDER — SCOPOLAMINE 1 MG/3DAYS TD PT72
MEDICATED_PATCH | TRANSDERMAL | Status: DC | PRN
  Administered 2018-11-21: 1 via TRANSDERMAL

## 2018-11-21 MED ORDER — MIDAZOLAM HCL 5 MG/5ML IJ SOLN
INTRAMUSCULAR | Status: DC | PRN
  Administered 2018-11-21: 2 mg via INTRAVENOUS

## 2018-11-21 SURGICAL SUPPLY — 52 items
ADH SKN CLS APL DERMABOND .7 (GAUZE/BANDAGES/DRESSINGS) ×1
APL SKNCLS STERI-STRIP NONHPOA (GAUZE/BANDAGES/DRESSINGS) ×1
BENZOIN TINCTURE PRP APPL 2/3 (GAUZE/BANDAGES/DRESSINGS) ×2 IMPLANT
BLADE CLIPPER SURG (BLADE) ×1 IMPLANT
BLADE SURG 15 STRL LF DISP TIS (BLADE) IMPLANT
BLADE SURG 15 STRL SS (BLADE) ×2
CANISTER SUCT 3000ML PPV (MISCELLANEOUS) ×1 IMPLANT
CHLORAPREP W/TINT 26ML (MISCELLANEOUS) ×2 IMPLANT
CLEANER TIP ELECTROSURG 2X2 (MISCELLANEOUS) ×1 IMPLANT
COVER SURGICAL LIGHT HANDLE (MISCELLANEOUS) ×2 IMPLANT
COVER WAND RF STERILE (DRAPES) ×1 IMPLANT
DECANTER SPIKE VIAL GLASS SM (MISCELLANEOUS) ×2 IMPLANT
DERMABOND ADVANCED (GAUZE/BANDAGES/DRESSINGS) ×1
DERMABOND ADVANCED .7 DNX12 (GAUZE/BANDAGES/DRESSINGS) IMPLANT
DRAPE LAPAROTOMY T 102X78X121 (DRAPES) ×2 IMPLANT
DRAPE UTILITY XL STRL (DRAPES) ×3 IMPLANT
ELECT CAUTERY BLADE 6.4 (BLADE) ×2 IMPLANT
ELECT REM PT RETURN 9FT ADLT (ELECTROSURGICAL) ×2
ELECTRODE REM PT RTRN 9FT ADLT (ELECTROSURGICAL) ×1 IMPLANT
GAUZE SPONGE 4X4 12PLY STRL (GAUZE/BANDAGES/DRESSINGS) ×2 IMPLANT
GLOVE BIO SURGEON STRL SZ8 (GLOVE) ×4 IMPLANT
GLOVE BIOGEL PI IND STRL 6.5 (GLOVE) IMPLANT
GLOVE BIOGEL PI IND STRL 8 (GLOVE) ×1 IMPLANT
GLOVE BIOGEL PI INDICATOR 6.5 (GLOVE) ×1
GLOVE BIOGEL PI INDICATOR 8 (GLOVE) ×1
GLOVE SURG SS PI 6.5 STRL IVOR (GLOVE) ×1 IMPLANT
GOWN STRL REUS W/ TWL LRG LVL3 (GOWN DISPOSABLE) ×2 IMPLANT
GOWN STRL REUS W/ TWL XL LVL3 (GOWN DISPOSABLE) ×1 IMPLANT
GOWN STRL REUS W/TWL LRG LVL3 (GOWN DISPOSABLE) ×2
GOWN STRL REUS W/TWL XL LVL3 (GOWN DISPOSABLE) ×2
KIT BASIN OR (CUSTOM PROCEDURE TRAY) ×2 IMPLANT
KIT TURNOVER KIT B (KITS) ×2 IMPLANT
NEEDLE 22X1 1/2 (OR ONLY) (NEEDLE) IMPLANT
NS IRRIG 1000ML POUR BTL (IV SOLUTION) ×2 IMPLANT
PACK SURGICAL SETUP 50X90 (CUSTOM PROCEDURE TRAY) ×2 IMPLANT
PAD ARMBOARD 7.5X6 YLW CONV (MISCELLANEOUS) ×4 IMPLANT
PENCIL BUTTON HOLSTER BLD 10FT (ELECTRODE) ×2 IMPLANT
SPECIMEN JAR SMALL (MISCELLANEOUS) ×2 IMPLANT
SPONGE LAP 18X18 X RAY DECT (DISPOSABLE) ×2 IMPLANT
SPONGE LAP 4X18 RFD (DISPOSABLE) ×1 IMPLANT
SUT MNCRL AB 3-0 PS2 18 (SUTURE) ×1 IMPLANT
SUT VIC AB 0 CT1 18XCR BRD 8 (SUTURE) IMPLANT
SUT VIC AB 0 CT1 8-18 (SUTURE) ×2
SUT VIC AB 3-0 SH 8-18 (SUTURE) ×1 IMPLANT
SWAB COLLECTION DEVICE MRSA (MISCELLANEOUS) IMPLANT
SWAB CULTURE ESWAB REG 1ML (MISCELLANEOUS) IMPLANT
SYR BULB 3OZ (MISCELLANEOUS) ×1 IMPLANT
SYR CONTROL 10ML LL (SYRINGE) IMPLANT
TOWEL OR 17X24 6PK STRL BLUE (TOWEL DISPOSABLE) ×1 IMPLANT
TOWEL OR 17X26 10 PK STRL BLUE (TOWEL DISPOSABLE) ×2 IMPLANT
TUBE CONNECTING 12X1/4 (SUCTIONS) ×1 IMPLANT
YANKAUER SUCT BULB TIP NO VENT (SUCTIONS) ×1 IMPLANT

## 2018-11-21 NOTE — H&P (Signed)
Jeremy Duncan Documented:  Location: [facility] Patient #: [id number] DOB: 07/20/1996 Single / Language: [place] / Race: White Male  History of Present Illness  Patient words: Patient returns for recheck of his pilonidal cyst disease. As been draining off and on but no recent infections. He does have pain and it does cause mild swelling of his clothing.  The patient is a 22 year old male.   Allergies  Penicillins Amoxicillin *PENICILLINS* Allergies Reconciled  Medication History  Synthroid (50MCG Tablet, Oral) Active. Medications Reconciled    Vitals Weight: 177.6 lb Height: 70in Body Surface Area: 1.98 m Body Mass Index: 25.48 kg/m  Temp.: 98.61F  Pulse: 98 (Regular)  BP: 122/76 (Sitting, Left Arm, Standard)      Physical Exam   General Mental Status-Alert. General Appearance-Consistent with stated age. Hydration-Well hydrated. Voice-Normal.  Chest and Lung Exam Chest and lung exam reveals -quiet, even and easy respiratory effort with no use of accessory muscles and on auscultation, normal breath sounds, no adventitious sounds and normal vocal resonance. Inspection Chest Wall - Normal. Back - normal.  Cardiovascular Cardiovascular examination reveals -normal heart sounds, regular rate and rhythm with no murmurs and normal pedal pulses bilaterally.  Rectal Note: Complex area of pilonidal disease measuring 5 cm x 3 cm. 2 open draining sinus tracts but no signs of redness or fluctuance today.  Neurologic Neurologic evaluation reveals -alert and oriented x 3 with no impairment of recent or remote memory. Mental Status-Normal.  Musculoskeletal Normal Exam - Left-Upper Extremity Strength Normal and Lower Extremity Strength Normal. Normal Exam - Right-Upper Extremity Strength Normal and Lower Extremity Strength Normal.    Assessment & Plan  PILONIDAL CYST (L05.91) Impression: ready  to schedule excision next month  Current Plans The anatomy of the intragluteal cleft was discussed. Pathophysiology of pilonidal disease was discussed. The importance of keeping hairs trimmed to avoid recurrence was discussed. Discussion of options such as curretage, excision with closure vs leaving open was discussed. Risks of infection with need for incision and drainage & antibiotics were discussed. I noted a good likelihood this will help address the problem.  At this point, I think the patient would best served with considering surgery to excise the diseased tissue. I will make an attempt to close but it may need to be left open to allow it to heal with secondary intention and wound packing. Possible recurrences need reoperation or different techniques were discussed as well. I noted that recurrence is higher with poor compliance on hair removal hygiene & overall health. The patient's questions were answered. The patient agrees to proceed.   PILONIDAL CYST WITHOUT INFECTION (L05.91)  Current Plans The anatomy of the gluteal and sacral region was discussed. Pathophysiology of pilonidal disease due to ingrown hairs was discussed. Importance of good hygiene discussed. Importance of keeping hairs trimmed in the intergluteal crease from anus to top of sacrum/tailbone spine noted. Need for good hygiene stressed. Use of electric razor or nose hair trimmers to keep the hairs trimmed discussed.  Risk of worsening progression needing surgical drainage of abscess or perhaps excision of chronic pilonidal disease with skin flap closure over drains discussed. Possible need for her to leave it open with packing to allow the wound close over several weeks/months discussed. Risks benefits alternatives to surgery discussed as well. Risk of recurrent disease discussed. Patient was expressed understanding. Literature given to patient. We will see if we can control this without an operation  first.  Pt Education - [facility] Pilonidal Disease (  AT) Pt Education - [facility] Pilonidal Disease (AT) Pt Education - [facility] Pilonidal Surgery HCI - post op instructions: discussed with patient and provided information. The anatomy of the intragluteal cleft was discussed. Pathophysiology of pilonidal disease was discussed. The importance of keeping hairs trimmed to avoid recurrence was discussed. Discussion of options such as curretage, excision with closure vs leaving open was discussed. Risks of infection with need for incision and drainage & antibiotics were discussed. I noted a good likelihood this will help address the problem.  At this point, I think the patient would best served with considering surgery to excise the diseased tissue. I will make an attempt to close but it may need to be left open to allow it to heal with secondary intention and wound packing. Possible recurrences need reoperation or different techniques were discussed as well. I noted that recurrence is higher with poor compliance on hair removal hygiene & overall health. The patient's questions were answered. The patient agrees to proceed.  Pt Education - [facility] Pilonidal Disease (AT) The anatomy of the gluteal and sacral region was discussed. Pathophysiology of pilonidal disease due to ingrown hairs was discussed. Importance of good hygiene discussed. Importance of keeping hairs trimmed in the intergluteal crease from anus to top of sacrum/tailbone spine noted. Need for good hygiene stressed. Use of electric razor or nose hair trimmers to keep the hairs trimmed discussed.  Risk of worsening progression needing surgical drainage of abscess or perhaps excision of chronic pilonidal disease with skin flap closure over drains discussed. Possible need for her to leave it open with packing to allow the wound close over several weeks/months discussed. Risks benefits alternatives to surgery discussed as well. Risk of recurrent disease  discussed. Patient was expressed understanding. Literature given to patient. We will see if we can control this without an operation first.

## 2018-11-21 NOTE — Transfer of Care (Signed)
Immediate Anesthesia Transfer of Care Note  Patient: Jeremy Duncan  Procedure(s) Performed: EXCISION OF PILONIDAL CYST (N/A )  Patient Location: PACU  Anesthesia Type:General  Level of Consciousness: patient cooperative and responds to stimulation  Airway & Oxygen Therapy: Patient Spontanous Breathing and Patient connected to face mask oxygen  Post-op Assessment: Report given to RN, Post -op Vital signs reviewed and stable and Patient moving all extremities X 4  Post vital signs: Reviewed and stable  Last Vitals:  Vitals Value Taken Time  BP    Temp    Pulse 93 11/21/2018  8:31 AM  Resp 14 11/21/2018  8:31 AM  SpO2 100 % 11/21/2018  8:31 AM  Vitals shown include unvalidated device data.  Last Pain:  Vitals:   11/21/18 0624  TempSrc:   PainSc: 0-No pain         Complications: No apparent anesthesia complications

## 2018-11-21 NOTE — Interval H&P Note (Signed)
History and Physical Interval Note:  11/21/2018 7:14 AM  Jeremy Duncan  has presented today for surgery, with the diagnosis of PILONIDAL CYST  The various methods of treatment have been discussed with the patient and family. After consideration of risks, benefits and other options for treatment, the patient has consented to  Procedure(s): EXCISION OF PILONIDAL CYST (N/A) as a surgical intervention .  The patient's history has been reviewed, patient examined, no change in status, stable for surgery.  I have reviewed the patient's chart and labs.  Questions were answered to the patient's satisfaction.     [name] A Halea Lieb

## 2018-11-21 NOTE — Op Note (Signed)
Preoperative diagnosis: Pilonidal cyst complex  Postoperative diagnosis: Same  Procedure: Excision of complex pilonidal cyst with intermediate closure measuring 4 cm  Surgeon: Erroll Luna, MD  Anesthesia: General with 0.25% Sensorcaine local with epinephrine  EBL: 10 cc  Specimen: Pilonidal cyst  Drains: None  IV fluids: Per anesthesia record  Indications for procedure: The patient presents for excision of a complex draining chronic pilonidal cyst.  He had it for a number of months and desires excision.  Risk, benefits and other treatment options discussed.  Preventive options and medical management discussed which she has failed already.The procedure has been discussed with the patient.  Alternative therapies have been discussed with the patient.  Operative risks include bleeding,  Infection,  Organ injury,  Nerve injury,  Blood vessel injury,  DVT,  Pulmonary embolism,  Death,  And possible reoperation.  Medical management risks include worsening of present situation.  The success of the procedure is 50 -90 % at treating patients symptoms.  The patient understands and agrees to proceed.   Description of procedure: The patient was met in the holding area and the procedure was reviewed as well as long-term expectations, care and complications.  He was then taken back to the operating room.  He was placed supine on a stretcher intubated and then placed prone and padded appropriately.  The gluteal cleft was taped apart and prepped and draped in sterile fashion.  Timeout was done.  Local anesthetic was then infiltrated into the gluteal cleft.  A 4 cm area was excised which was complex with 2 sinus tracts.  All tissue containing pilonidal disease was excised back to healthy tissue.  I then mobilized the skin for an intermediate closure.  This was done for 2 cm circumferentially.  After this was done I then closed the wound with a deep layer of 0 Vicryl to approximate the deep tissues.  3-0 Vicryl  was used to approximate the intermediate level tissues and 3-0 Monocryl is used to close the skin.  This flattened out the gluteal cleft.  Dermabond applied.  Hemostasis was excellent.  All final counts found to be correct.  Patient was then placed supine extubated taken recovery in satisfactory condition.

## 2018-11-21 NOTE — Discharge Instructions (Signed)
GENERAL SURGERY: POST OP INSTRUCTIONS ° °###################################################################### ° °EAT °Gradually transition to a high fiber diet with a fiber supplement over the next few weeks after discharge.  Start with a pureed / full liquid diet (see below) ° °WALK °Walk an hour a day.  Control your pain to do that.   ° °CONTROL PAIN °Control pain so that you can walk, sleep, tolerate sneezing/coughing, go up/down stairs. ° °HAVE A BOWEL MOVEMENT DAILY °Keep your bowels regular to avoid problems.  OK to try a laxative to override constipation.  OK to use an antidairrheal to slow down diarrhea.  Call if not better after 2 tries ° °CALL IF YOU HAVE PROBLEMS/CONCERNS °Call if you are still struggling despite following these instructions. °Call if you have concerns not answered by these instructions ° °###################################################################### ° ° ° °1. DIET: Follow a light bland diet the first 24 hours after arrival home, such as soup, liquids, crackers, etc.  Be sure to include lots of fluids daily.  Avoid fast food or heavy meals as your are more likely to get nauseated.   °2. Take your usually prescribed home medications unless otherwise directed. °3. PAIN CONTROL: °a. Pain is best controlled by a usual combination of three different methods TOGETHER: °i. Ice/Heat °ii. Over the counter pain medication °iii. Prescription pain medication °b. Most patients will experience some swelling and bruising around the incisions.  Ice packs or heating pads (30-60 minutes up to 6 times a day) will help. Use ice for the first few days to help decrease swelling and bruising, then switch to heat to help relax tight/sore spots and speed recovery.  Some people prefer to use ice alone, heat alone, alternating between ice & heat.  Experiment to what works for you.  Swelling and bruising can take several weeks to resolve.   °c. It is helpful to take an over-the-counter pain medication  regularly for the first few weeks.  Choose one of the following that works best for you: °i. Naproxen (Aleve, etc)  Two 220mg tabs twice a day °ii. Ibuprofen (Advil, etc) Three 200mg tabs four times a day (every meal & bedtime) °iii. Acetaminophen (Tylenol, etc) 500-650mg four times a day (every meal & bedtime) °d. A  prescription for pain medication (such as oxycodone, hydrocodone, etc) should be given to you upon discharge.  Take your pain medication as prescribed.  °i. If you are having problems/concerns with the prescription medicine (does not control pain, nausea, vomiting, rash, itching, etc), please call us [phone] to see if we need to switch you to a different pain medicine that will work better for you and/or control your side effect better. °ii. If you need a refill on your pain medication, please contact your pharmacy.  They will contact our office to request authorization. Prescriptions will not be filled after 5 pm or on week-ends. °4. Avoid getting constipated.  Between the surgery and the pain medications, it is common to experience some constipation.  Increasing fluid intake and taking a fiber supplement (such as Metamucil, Citrucel, FiberCon, MiraLax, etc) 1-2 times a day regularly will usually help prevent this problem from occurring.  A mild laxative (prune juice, Milk of Magnesia, MiraLax, etc) should be taken according to package directions if there are no bowel movements after 48 hours.   °5. Wash / shower every day.  You may shower over the dressings as they are waterproof.  Continue to shower over incision(s) after the dressing is off. °6. Remove your waterproof bandages   5 days after surgery.  You may leave the incision open to air.  You may have skin tapes (Steri Strips) covering the incision(s).  Leave them on until one week, then remove.  You may replace a dressing/Band-Aid to cover the incision for comfort if you wish.  ° ° ° ° °7. ACTIVITIES as tolerated:   °a. You may resume  regular (light) daily activities beginning the next day--such as daily self-care, walking, climbing stairs--gradually increasing activities as tolerated.  If you can walk 30 minutes without difficulty, it is safe to try more intense activity such as jogging, treadmill, bicycling, low-impact aerobics, swimming, etc. °b. Save the most intensive and strenuous activity for last such as sit-ups, heavy lifting, contact sports, etc  Refrain from any heavy lifting or straining until you are off narcotics for pain control.   °c. DO NOT PUSH THROUGH PAIN.  Let pain be your guide: If it hurts to do something, don't do it.  Pain is your body warning you to avoid that activity for another week until the pain goes down. °d. You may drive when you are no longer taking prescription pain medication, you can comfortably wear a seatbelt, and you can safely maneuver your car and apply brakes. °e. You may have sexual intercourse when it is comfortable.  °8. FOLLOW UP in our office °a. Please call [facility] at [phone] to set up an appointment to see your surgeon in the office for a follow-up appointment approximately 2-3 weeks after your surgery. °b. Make sure that you call for this appointment the day you arrive home to insure a convenient appointment time. °9. IF YOU HAVE DISABILITY OR FAMILY LEAVE FORMS, BRING THEM TO THE OFFICE FOR PROCESSING.  DO NOT GIVE THEM TO YOUR DOCTOR. ° ° °WHEN TO CALL US [phone]: °1. Poor pain control °2. Reactions / problems with new medications (rash/itching, nausea, etc)  °3. Fever over 101.5 F (38.5 C) °4. Worsening swelling or bruising °5. Continued bleeding from incision. °6. Increased pain, redness, or drainage from the incision °7. Difficulty breathing / swallowing ° ° The clinic staff is available to answer your questions during regular business hours (8:30am-5pm).  Please don’t hesitate to call and ask to speak to one of our nurses for clinical concerns.  ° If you have a medical emergency,  go to the nearest emergency room or call 911. ° A surgeon from [facility] is always on call at the hospitals ° ° °[facility] °[address], [address], [place], [place]  [address] ? °MAIN: [phone] ? TOLL FREE: [phone] ?  °FAX [phone] °[email or web address] ° °

## 2018-11-21 NOTE — Anesthesia Postprocedure Evaluation (Signed)
Anesthesia Post Note  Patient: Jeremy Duncan  Procedure(s) Performed: EXCISION OF PILONIDAL CYST (N/A )     Patient location during evaluation: PACU Anesthesia Type: General Level of consciousness: awake Pain management: pain level controlled Vital Signs Assessment: post-procedure vital signs reviewed and stable Respiratory status: spontaneous breathing Cardiovascular status: stable Postop Assessment: no apparent nausea or vomiting Anesthetic complications: no    Last Vitals:  Vitals:   11/21/18 0908 11/21/18 0915  BP:    Pulse: 86 86  Resp: 14 13  Temp: 36.6 C   SpO2: 100% 100%    Last Pain:  Vitals:   11/21/18 0900  TempSrc:   PainSc: 0-No pain   Pain Goal:                 Jeremy Duncan

## 2018-11-22 ENCOUNTER — Encounter (HOSPITAL_COMMUNITY): Payer: Self-pay | Admitting: Surgery

## 2019-02-25 ENCOUNTER — Ambulatory Visit (INDEPENDENT_AMBULATORY_CARE_PROVIDER_SITE_OTHER): Payer: BLUE CROSS/BLUE SHIELD | Admitting: "Endocrinology

## 2019-02-25 ENCOUNTER — Other Ambulatory Visit: Payer: Self-pay

## 2019-02-25 ENCOUNTER — Encounter (INDEPENDENT_AMBULATORY_CARE_PROVIDER_SITE_OTHER): Payer: Self-pay | Admitting: "Endocrinology

## 2019-02-25 VITALS — BP 120/74 | HR 88 | Ht 69.29 in | Wt 176.0 lb

## 2019-02-25 DIAGNOSIS — I10 Essential (primary) hypertension: Secondary | ICD-10-CM | POA: Diagnosis not present

## 2019-02-25 DIAGNOSIS — R1013 Epigastric pain: Secondary | ICD-10-CM

## 2019-02-25 DIAGNOSIS — E063 Autoimmune thyroiditis: Secondary | ICD-10-CM | POA: Diagnosis not present

## 2019-02-25 DIAGNOSIS — E049 Nontoxic goiter, unspecified: Secondary | ICD-10-CM | POA: Diagnosis not present

## 2019-02-25 LAB — TSH: TSH: 1.96 mIU/L (ref 0.40–4.50)

## 2019-02-25 LAB — T3, FREE: T3, Free: 3.8 pg/mL (ref 2.3–4.2)

## 2019-02-25 LAB — T4, FREE: Free T4: 1.4 ng/dL (ref 0.8–1.8)

## 2019-02-25 NOTE — Progress Notes (Signed)
Subjective:  Patient Name: Jeremy Duncan Date of Birth: 15-Jun-1996  MRN: [id number]  Jeremy Duncan  presents to the office today for follow-up of his acquired hypothyroidism, Hashimoto's disease, goiter, obesity, hyperlipidemia, hypertension, prediabetes, gynecomastia, dyspepsia, and GERD.  HISTORY OF PRESENT ILLNESS:   Jeremy Duncan is a 23 y.o. Caucasian young man. Jeremy Duncan was unaccompanied.  1. Jeremy Duncan was first referred to me on 10/21/07 by his PCP, Dr. Victorino Dike Duncan of [facility], for evaluation and management of hypothyroidism. He was 11 years and 7 months old at the time.  A. The child was the product of an uncomplicated pregnancy. His mother had gestational diabetes mellitus. He was delivered by C-section. Birth weight was 6 lbs. 4 oz. He was a healthy newborn. He was also a healthy infant. Between the ages of 25 and 9 he experienced a marked increase in weight. One year he gained 20 pounds. He also developed hypertension. Laboratory tests on 06/13/07 showed a TSH of 6.09. Repeat studies on 06/17/07 showed a TSH of 6.283. Free T4 was 1.22. Jeremy Duncan called me and I suggested that she start him on Synthroid 25 mcg per day. Thyroid function tests on 07/10/2007 showed that the TSH decreased to 4.055. I suggested that the Synthroid dose be increased to 37.5 mcg per day.   B. The patient's pertinent review of systems revealed that he was always hungry. He had been placed on Prevacid but was only taking it as needed. Previous laboratory tests had shown hypercholesterolemia and hyperinsulinemia. A fasting insulin value was 20 when the accompanying blood glucose was 85. Family history was positive for a maternal great-grandmother who took thyroid pills. It was believed that she had never had surgery or radiation to her neck. That same maternal great-grandmother had had cancer of her breast and bone. Both the father and mother had significant stomach acid issues.There was a family history of  overweight/obesity. [Addendum 08/26/18: There is a history of high BP on his mom's side of the family.]  C. On physical examination his height was at the 75th percentile. His weight was at the 97th percentile. BMI was greater than the 97th percentile. His blood pressure was 131/86. He was definitely obese. He had a 12 gm goiter. His right lobe was tender to palpation. Breast tissue was early Tanner stage II. The right areola was 22 mm. The left areola was 25 mm. He had Tanner stage II pubic hair. Testes were to 2-3 mL in volume. Lab tests that day showed a normal CMP. His TSH was 2.773. His free T4 was 1.14. His free T3 was 1.3. FSH was 2.8 and LH 0.4. Testosterone was 25.3. Estradiol was less than 10. Androstenedione was 65.  D. It appeared at that time that the patient had acquired primary hypothyroidism secondary to Hashimoto's disease. His thyroid gland was definitely tender, consistent with a flare-up of Hashimoto's disease at that time. He was obese, in part due to extreme hunger caused by dyspepsia. Family history was definitely positive for dyspepsia and overweight/obesity. Previous labs had shown hyperinsulinemia. Therefore, it was predictable that his testosterone would be slightly elevated for age. I felt that his hypertension was most likely a result of his obesity and that if he could lose enough weight, the hypertension would likely resolve as well. I started the child on metformin, 500 mg twice daily. I discussed with the family our Eat Right diet plan and our Exercise Right plan.  2. During the past 11 years, Jeremy Duncan has lost some additional thyroid  cells. As a result, I increased his Synthroid dose to 50 mcg/day. Since then he has remained euthyroid. During the first year, his weight dropped from the 97th to the 75th percentile, but then gradually rose again to about the 82nd percentile. He had a growth spurt at age 57. When his weight decreased to the 75th percentile, his blood pressure decreased  to 111/69. When the weight then increased to the 82nd percentile, the blood pressure increased to 125/82. His weight percentile peaked at 88% in October 2013, in part due to a large increase in muscle mass. The gynecomastia has gradually resolved over time.   3. The patient's last [facility] visit was on 08/26/18. In the interim, he has been healthy.    A. He had removal of a pilonidal cyst in December. He had to stop weight lifting for several months, but has recently resumed. He lifts weights 3 times per week and does more cardio.   B.  He takes his Synthroid, 50 mcg once daily almost every day.     4. Pertinent Review of Systems:  Constitutional: [name] feels "pretty good". Energy level is "good". He is quite active. Eyes: Vision has improved so he only needs to wear his glasses for distance. There are no other recognized eye problems. Neck: The patient has no complaints of anterior neck swelling, soreness, tenderness, pressure, discomfort, or difficulty swallowing.   Heart: Heart rate increases with exercise or other physical activity. The patient has no complaints of palpitations, irregular heart beats, chest pain, or chest pressure.   Gastrointestinal: He probably has about the same amount of belly hunger that he did at his last visit, but not excessively so. Bowel movents seem normal. The patient has no complaints of acid reflux, upset stomach, stomach aches or pains, diarrhea, or constipation.  Hands: He texts and plays video games quite well.  Legs: Muscle mass and strength seem normal. There are no complaints of numbness, tingling, burning, or pain. No edema is noted.  Feet: There are no obvious foot problems. There are no complaints of numbness, tingling, burning, or pain. No edema is noted. Neurologic: There are no recognized problems with muscle movement and strength, sensation, or coordination. Breasts: Breast tissue remains about the same.  Skin: His striae have continued to fade.        PAST MEDICAL, FAMILY, AND SOCIAL HISTORY  Past Medical History:  Diagnosis Date  . Acquired autoimmune hypothyroidism   . Dyspepsia   . GERD (gastroesophageal reflux disease)    Pt denies  . Goiter   . Gynecomastia, male   . Heart murmur    used to have slight heart murmur as child, not anymore  . Hyperlipidemia type II   . Hypertension    "a little high"  . Isosexual precocity   . Obesity   . Prediabetes    "I used to" 65 or 23 years old  . Thyroiditis, autoimmune     Family History  Problem Relation Age of Onset  . Diabetes Mother        Gestational diabetes mellitus     Current Outpatient Medications:  .  SYNTHROID 50 MCG tablet, TAKE 1 TABLET BY MOUTH EVERY DAY, Disp: 90 tablet, Rfl: 1 .  ibuprofen (ADVIL,MOTRIN) 800 MG tablet, Take 1 tablet (800 mg total) by mouth every 8 (eight) hours as needed. (Patient not taking: Reported on 02/25/2019), Disp: 30 tablet, Rfl: 0   reports that he has never smoked. He has never used smokeless tobacco. He  reports that he does not drink alcohol or use drugs. Pediatric History  Patient Parents/Guardians  . Shawgo,Wanda (Mother/Guardian)  . Saintvil,Darin (Father)   Other Topics Concern  . Not on file  Social History Narrative  . Not on file   1. School and Family: The patient is in his senior year at [employer].  He is majoring in Chief Financial Officer. He is beginning to search for jobs now.  2. Activities: He lifts weights 3 times per week and does more cardio. 3. Primary Care Provider: Dr. Lupe Carney, [facility]  REVIEW OF SYSTEMS: There are no other significant problems involving Saylor's other body systems.   Objective:  Vital Signs:  BP 120/74   Pulse 88   Ht 5' 9.29" (1.76 m)   Wt 176 lb (79.8 kg)   BMI 25.77 kg/m    Ht Readings from Last 3 Encounters:  02/25/19 5' 9.29" (1.76 m)  11/13/18  (1.778 m)  08/26/18 5' 9.02" (1.753 m)   Wt Readings from Last 3 Encounters:  02/25/19 176 lb (79.8  kg)  11/13/18 173 lb 1.6 oz (78.5 kg)  08/26/18 173 lb (78.5 kg)   Body surface area is 1.98 meters squared.  PHYSICAL EXAM:  Constitutional: The patient appears healthy and much older and more mature than his chronologic age. He is definitely quite muscular. His height growth has ceased. His weight has increased by 3 pounds in 6 months. He is alert and bright. His affect and insight are normal.    Head: The head is normocephalic. Face: The face appears normal. There are no obvious dysmorphic features. Eyes: The eyes appear to be normally formed and spaced. Gaze is conjugate. There is no obvious arcus or proptosis. Moisture appears normal. Ears: The ears are normally placed and appear externally normal. Mouth: The oropharynx and tongue appear normal. Dentition appears to be normal for age. Oral moisture is normal. Neck: The neck is visibly enlarged. No carotid bruits are noted. The strap muscles are still quite large. It is still difficult to assess his thyroid gland size now, but his thyroid is probably still enlarged at about 22+ grams in size. The isthmus is not enlarged today. The lobes are symmetrically enlarged.  The consistency of the thyroid gland is normal. The thyroid gland is not tender to palpation. Lungs: The lungs are clear to auscultation. Air movement is good. Heart: Heart rate and rhythm are regular. Heart sounds S1 and S2 are normal. He has grade 1/6 systolic flow murmur that sounds benign. I did not appreciate any pathologic cardiac murmurs. Abdomen: The abdomen is normal in size for the patient's age. He probably has more abdominal fat than at his last visit. Bowel sounds are normal. There is no obvious hepatomegaly, splenomegaly, or other mass effect.  Arms: Muscle size and bulk are normal for age. Hands: There is no tremor today. Phalangeal and metacarpophalangeal joints are normal. Palmar muscles are normal for age. He has no palmar erythema. He has no excess palmar  moisture.  Legs: Muscles appear normal for age. No edema is present. Neurologic: Strength is normal for age in both the upper and lower extremities. Muscle tone is normal. Sensation to touch is normal in both legs.   Breast tissue: Breasts are Tanner stage I. Areolae measure 25 mm on the right and 30 mm on the left, compared with 25 mm on the right and 28 mm on the left at his last visit. I do not feel breast buds.  Skin:  He has several old, faded, pale striae at his flanks.    LAB DATA:   Labs 08/26/18: HbA1c 5.1%; TSH 1.42, free T4 1.3, free T3 4.2  Labs 02/20/18: HbA1c 5.1%, CBG 95; TSH 1.65, free T4 1.4, free T3 3.6  Labs 07/05/17: HbA1c 4.9%, CBG 159; TSH 1.64, free T4 1.3, free T3 3.6  Labs 11/21/16: TSH 0.79, free T4 1.2, free T3 3.5  Lab s 04/24/16: TSH 3.49, free T4 1.0, free T3 3.4 after missing several Synthroid doses  Labs 10/23/15: TSH 1.657, free T4 1.20, free T3 3.5  Labs 05/31/15: HbA1c 5.1%; TSH 2.318, free T4 1.26, free T3 3.9  Labs 11/09/14: 24-hour urine free cortisol 3.6, compared with 65.9 three months previously. 24-hour urine creatinine was 2090; TSH 2.836, free T4 1.04, free T3 4.0  Labs 08/22/14: HbA1c 5.6%, compared with 5.1% art last visit and with 4.4% two years ago. TSH 2.675, free T4 1.18, free T3 4.4  Labs 02/24/13: TSH 1.986, free T4 1.17, free T3 3.7   Labs 12/28/11: TSH 2.544, Free T4 1.49, Free T3 4.1   Labs 05/13/11: TSH 1.642. Free T4 1.21. Free T3 4.0.   Assessment and Plan:   ASSESSMENT:  1.  Hypothyroid:   A. The patient was euthyroid in January 2013, in March 2014, in September 2015, and in December 2015, although his TSH had been >2.0. In November 2016 his TFTs were mid-range normal. In May 2017, however, his TSH was elevated after missing many Synthroid doses. I asked him to take the medication daily. His TFTs in December 2017 were at about the 80% of the normal range. His TFTs at his visit in August 2018, in March 2019, and in September 2019  were mid-euthyroid. He was supposed to have his TFTs repeated prior to this visit, but did not do so. We will repeat his TFTs today.   B. His treatment TSH goal is between 1.0-2.0.  C. His current Synthroid dose was good in September 2019, but was still relatively low for a young man of his age. He still has many thyrocytes that are functional, thyrocytes that are subject to further attack by his Hashimoto's T lymphocytes. He will likely lose more thyrocytes over time and require higher replacement doses of Synthroid.   2.  Thyroiditis: His thyroid inflammation is clinically quiescent. The shift of all three TFTs upward or downward together that he has had in the past is pathognomonic for a flare up of Hashimoto's disease.  3.  Goiter: Thyroid gland is probably about the same size as it was at his last visit, or perhaps a little smaller. The process of waxing and waning of the thyroid gland size is consistent with episodes of thyroid inflammation. 4. Overweight: Ellard is muscular. He is not overweight. 5. Hypertension: His SBP is good today, but his DBP is still a bit high.    6. Dyspepsia: This problem is mildly active, but not significant.      7. Pre-diabetes: His A1c was mid-range normal in March and in November 2019. He is not "pre-diabetic" any longer in a clinical sense. Unless he gains a lot of fat weight there will not be any need to follow this problem.  8. Striae: His striae have essentially resolved.   PLAN:  1. Diagnostic: TFTs and HbA1c today and again in 6 months at his next visit. 2. Therapeutic:  Continue current dose of Synthroid. Try to fit in daily cardio exercise, but at least 3 times per week. I  encouraged him to drink more water. If his belly hunger worsens he may need to begin treatment with omeprazole.  3. Patient education: We talked about the need to continue to eat right and to exercise. We also talked about the natural course of Hashimoto's disease and hypothyroidism. I  suspect that he will become progressively more hypothyroid over time. Given his family history of hypertension it would be prudent to do more cardio.  4. Follow-up: 6 months. Call if having more dyspepsia.  Level of Service: This visit lasted in excess of 40 minutes. More than 50% of the visit was devoted to counseling.  Molli Knock, MD, CDE Adult and Pediatric Endocrinology

## 2019-02-25 NOTE — Patient Instructions (Signed)
Follow up visit in 6 months. Please repeat lab tests 1-2 weeks prior if possible. 

## 2019-02-28 ENCOUNTER — Encounter (INDEPENDENT_AMBULATORY_CARE_PROVIDER_SITE_OTHER): Payer: Self-pay | Admitting: *Deleted

## 2019-04-14 DIAGNOSIS — Z9889 Other specified postprocedural states: Secondary | ICD-10-CM | POA: Diagnosis not present

## 2019-05-25 ENCOUNTER — Other Ambulatory Visit (INDEPENDENT_AMBULATORY_CARE_PROVIDER_SITE_OTHER): Payer: Self-pay | Admitting: "Endocrinology

## 2019-05-25 DIAGNOSIS — E034 Atrophy of thyroid (acquired): Secondary | ICD-10-CM

## 2019-06-30 DIAGNOSIS — L7682 Other postprocedural complications of skin and subcutaneous tissue: Secondary | ICD-10-CM | POA: Diagnosis not present

## 2019-07-18 ENCOUNTER — Ambulatory Visit: Payer: Self-pay | Admitting: Surgery

## 2019-07-18 DIAGNOSIS — Z9889 Other specified postprocedural states: Secondary | ICD-10-CM | POA: Diagnosis not present

## 2019-07-18 DIAGNOSIS — L7682 Other postprocedural complications of skin and subcutaneous tissue: Secondary | ICD-10-CM | POA: Diagnosis not present

## 2019-07-18 NOTE — H&P (Signed)
Jeremy Duncan Documented: 07/18/2019 11:03 AM Location: [facility] Patient #: [id number] DOB: Apr 29, 1996 Single / Language: Cleophus Molt / Race: White Male  History of Present Illness Jeremy Moores A. Kayah Hecker MD; 07/18/2019 11:52 AM) Patient words: Patient returns for follow-up of a chronic colonized wound status post excision 12 2019. Unfortunately, this is not healing after almost a half months. We have applied silver nitrate but there is a 2 Center wound and a small sinus tract from that thicker superior that have not healed. No signs of infection.  The patient is a 23 year old male.   Problem List/Past Medical Sharyn Lull R. Brooks, CMA; 07/18/2019 11:03 AM) PILONIDAL CYST WITHOUT INFECTION (L05.91) HISTORY OF EXCISION OF PILONIDAL CYST (H08.657)  Past Surgical History Sharyn Lull R. Brooks, CMA; 07/18/2019 11:03 AM) No pertinent past surgical history  Diagnostic Studies History Sharyn Lull R. Brooks, CMA; 07/18/2019 11:03 AM) Colonoscopy never  Allergies Sharyn Lull R. Brooks, CMA; 07/18/2019 11:03 AM) Penicillins Amoxicillin *PENICILLINS*  Medication History (Michelle R. Brooks, CMA; 07/18/2019 11:03 AM) Synthroid (50MCG Tablet, Oral) Active. Medications Reconciled  Social History Sharyn Lull R. Brooks, CMA; 07/18/2019 11:03 AM) Caffeine use Carbonated beverages, Coffee, Tea. No alcohol use No drug use Tobacco use Never smoker.  Family History Sharyn Lull R. Brooks, CMA; 07/18/2019 11:03 AM) Alcohol Abuse Family Members In General. Arthritis Family Members In General. Diabetes Mellitus Mother. Hypertension Family Members In General.  Other Problems Sharyn Lull R. Brooks, [place]; 07/18/2019 11:03 AM) Anxiety Disorder Thyroid Disease    Vitals Sharyn Lull R. Brooks CMA; 07/18/2019 11:03 AM) 07/18/2019 11:03 AM Weight: 181.25 lb Height: 69in Body Surface Area: 1.98 m Body Mass Index: 26.77 kg/m  Pulse: 88 (Regular)  BP: 126/72 (Sitting, Left Arm,  Standard)        Physical Exam (Barrington Worley A. Garlin Batdorf MD; 07/18/2019 11:53 AM)  Integumentary Note: Gluteal cleft shows a 2 cm x 8 mm x 4 mm wound midline. Small sinus tract above that which fractured 2 cm. No signs of abscess or infection.  Chest and Lung Exam Chest and lung exam reveals -quiet, even and easy respiratory effort with no use of accessory muscles and on auscultation, normal breath sounds, no adventitious sounds and normal vocal resonance. Inspection Chest Wall - Normal. Back - normal.  Cardiovascular Cardiovascular examination reveals -on palpation PMI is normal in location and amplitude, no palpable S3 or S4. Normal cardiac borders., normal heart sounds, regular rate and rhythm with no murmurs, carotid auscultation reveals no bruits and normal pedal pulses bilaterally.  Neurologic Neurologic evaluation reveals -alert and oriented x 3 with no impairment of recent or remote memory. Mental Status-Normal.  Musculoskeletal Normal Exam - Left-Upper Extremity Strength Normal and Lower Extremity Strength Normal. Normal Exam - Right-Upper Extremity Strength Normal, Lower Extremity Weakness.    Assessment & Plan (Maston Wight A. Daneen Volcy MD; 07/18/2019 11:54 AM)  HISTORY OF EXCISION OF PILONIDAL CYST (Z98.890) Impression: silver nitrate applied   Discussed wound care at this point time the potential need for additional surgery. Not sure this will heal without advancement flap at this point in time.   Discussed advancement flap closure at this point in time success rates of roughly 90% healing this. If this fails, he would need rotational flap probably. I discussed the cosmetic differences that the wound would be moved at midline. Recovery is about 6 weeks but he breaks down again or surgery which she is aware of. He would like to proceed sometime in the mid to late fall he states. We will contact him to make arrangements.  Current Plans Pt Education - [facility] General  Post-op HCI The anatomy of the intragluteal cleft was discussed. Pathophysiology of pilonidal disease was discussed. The importance of keeping hairs trimmed to avoid recurrence was discussed. Discussion of options such as curretage, excision with closure vs leaving open was discussed. Risks of infection with need for incision and drainage & antibiotics were discussed. I noted a good likelihood this will help address the problem.  At this point, I think the patient would best served with considering surgery to excise the diseased tissue. I will make an attempt to close but it may need to be left open to allow it to heal with secondary intention and wound packing. Possible recurrences need reoperation or different techniques were discussed as well. I noted that recurrence is higher with poor compliance on hair removal hygiene & overall health. The patient's questions were answered. The patient agrees to proceed.

## 2019-08-28 ENCOUNTER — Ambulatory Visit (INDEPENDENT_AMBULATORY_CARE_PROVIDER_SITE_OTHER): Payer: BLUE CROSS/BLUE SHIELD | Admitting: "Endocrinology

## 2019-09-19 DIAGNOSIS — Z9889 Other specified postprocedural states: Secondary | ICD-10-CM | POA: Diagnosis not present

## 2019-09-27 ENCOUNTER — Other Ambulatory Visit (HOSPITAL_COMMUNITY): Payer: BLUE CROSS/BLUE SHIELD

## 2019-10-01 ENCOUNTER — Ambulatory Visit (HOSPITAL_BASED_OUTPATIENT_CLINIC_OR_DEPARTMENT_OTHER): Admit: 2019-10-01 | Payer: BLUE CROSS/BLUE SHIELD | Admitting: Surgery

## 2019-10-01 ENCOUNTER — Encounter (HOSPITAL_BASED_OUTPATIENT_CLINIC_OR_DEPARTMENT_OTHER): Payer: Self-pay

## 2019-10-01 SURGERY — EXCISION, PILONIDAL CYST, EXTENSIVE
Anesthesia: General

## 2019-10-14 ENCOUNTER — Other Ambulatory Visit: Payer: Self-pay

## 2019-10-14 ENCOUNTER — Encounter (INDEPENDENT_AMBULATORY_CARE_PROVIDER_SITE_OTHER): Payer: Self-pay | Admitting: "Endocrinology

## 2019-10-14 ENCOUNTER — Ambulatory Visit (INDEPENDENT_AMBULATORY_CARE_PROVIDER_SITE_OTHER): Payer: BC Managed Care – PPO | Admitting: "Endocrinology

## 2019-10-14 VITALS — BP 132/80 | HR 78 | Ht 69.21 in | Wt 176.4 lb

## 2019-10-14 DIAGNOSIS — E049 Nontoxic goiter, unspecified: Secondary | ICD-10-CM

## 2019-10-14 DIAGNOSIS — I10 Essential (primary) hypertension: Secondary | ICD-10-CM | POA: Diagnosis not present

## 2019-10-14 DIAGNOSIS — E063 Autoimmune thyroiditis: Secondary | ICD-10-CM | POA: Diagnosis not present

## 2019-10-14 NOTE — Patient Instructions (Signed)
Follow up visit in 6 months. Please repeat lab tests about one week prior.  

## 2019-10-14 NOTE — Progress Notes (Signed)
Subjective:  Patient Name: Jeremy Duncan Date of Birth: [birth year]  MRN: [id number]  [name]  presents to the office today for follow-up of his acquired hypothyroidism, Hashimoto's disease, goiter, obesity, hyperlipidemia, hypertension, prediabetes, gynecomastia, dyspepsia, and GERD.  HISTORY OF PRESENT ILLNESS:   [name] is a 23 y.o. Caucasian young man. [name] was unaccompanied.  1. [name] was first referred to me on 10/21/07 by his PCP, Dr. Victorino Dike Summer of [facility], for evaluation and management of hypothyroidism. He was 11 years and 7 months old at the time.  A. The child was the product of an uncomplicated pregnancy. His mother had gestational diabetes mellitus. He was delivered by C-section. Birth weight was 6 lbs. 4 oz. He was a healthy newborn. He was also a healthy infant. Between the ages of 89 and 9 he experienced a marked increase in weight. One year he gained 20 pounds. He also developed hypertension. Laboratory tests on 06/13/07 showed a TSH of 6.09. Repeat studies on 06/17/07 showed a TSH of 6.283. Free T4 was 1.22. Dr. Vaughan Basta called me and I suggested that she start him on Synthroid 25 mcg per day. Thyroid function tests on 07/10/2007 showed that the TSH decreased to 4.055. I suggested that the Synthroid dose be increased to 37.5 mcg per day.   B. The patient's pertinent review of systems revealed that he was always hungry. He had been placed on Prevacid but was only taking it as needed. Previous laboratory tests had shown hypercholesterolemia and hyperinsulinemia. A fasting insulin value was 20 when the accompanying blood glucose was 85. Family history was positive for a maternal great-grandmother who took thyroid pills. It was believed that she had never had surgery or radiation to her neck. That same maternal great-grandmother had had cancer of her breast and bone. Both the father and mother had significant stomach acid issues.There was a family history of  overweight/obesity. [Addendum 08/26/18: There is a history of high BP on his mom's side of the family.]  C. On physical examination his height was at the 75th percentile. His weight was at the 97th percentile. BMI was greater than the 97th percentile. His blood pressure was 131/86. He was definitely obese. He had a 12 gm goiter. His right lobe was tender to palpation. Breast tissue was early Tanner stage II. The right areola was 22 mm. The left areola was 25 mm. He had Tanner stage II pubic hair. Testes were to 2-3 mL in volume. Lab tests that day showed a normal CMP. His TSH was 2.773. His free T4 was 1.14. His free T3 was 1.3. FSH was 2.8 and LH 0.4. Testosterone was 25.3. Estradiol was less than 10. Androstenedione was 65.  D. It appeared at that time that the patient had acquired primary hypothyroidism secondary to Hashimoto's disease. His thyroid gland was definitely tender, consistent with a flare-up of Hashimoto's disease at that time. He was obese, in part due to extreme hunger caused by dyspepsia. Family history was definitely positive for dyspepsia and overweight/obesity. Previous labs had shown hyperinsulinemia. Therefore, it was predictable that his testosterone would be slightly elevated for age. I felt that his hypertension was most likely a result of his obesity and that if he could lose enough weight, the hypertension would likely resolve as well. I started the child on metformin, 500 mg twice daily. I discussed with the family our Eat Right diet plan and our Exercise Right plan.  2. During the past 12 years, Ata has lost some additional thyroid  cells. As a result, I increased his Synthroid dose to 50 mcg/day. Since then he has remained euthyroid. During the first year, his weight dropped from the 97th to the 75th percentile, but then gradually rose again to about the 82nd percentile. He had a growth spurt at age 23. When his weight decreased to the 75th percentile, his blood pressure decreased  to 111/69. When the weight then increased to the 82nd percentile, the blood pressure increased to 125/82. His weight percentile peaked at 88% in October 2013, in part due to a large increase in muscle mass. The gynecomastia has gradually resolved over time.   3. The patient's last [facility] visit was on 02/25/19. I continued his current Synthroid dose.  A. In the interim, he has been healthy.    B. He had removal of a pilonidal cyst in December. That area finally healed up this Fall.   C. He lifts weights 3 times per week and runs about once a week.    D.  He takes his Synthroid, 50 mcg once daily.     4. Pertinent Review of Systems:  Constitutional: Maximiliano feels "pretty good". Energy level is "good". He is active. Eyes: Vision has improved so he only needs to wear his glasses for distance. There are no other recognized eye problems. Neck: The patient has no complaints of anterior neck swelling, soreness, tenderness, pressure, discomfort, or difficulty swallowing.   Heart: Heart rate increases with exercise or other physical activity. The patient has no complaints of palpitations, irregular heart beats, chest pain, or chest pressure.   Gastrointestinal: He probably has less belly hunger that he did at his last visit. Bowel movents seem normal. The patient has no complaints of acid reflux, upset stomach, stomach aches or pains, diarrhea, or constipation.  Hands: He texts and plays video games quite well.  Legs: Muscle mass and strength seem normal. There are no complaints of numbness, tingling, burning, or pain. No edema is noted.  Feet: There are no obvious foot problems. There are no complaints of numbness, tingling, burning, or pain. No edema is noted. Neurologic: There are no recognized problems with muscle movement and strength, sensation, or coordination. Breasts: Breast tissue is smaller.   Skin: His striae have continued to fade.       PAST MEDICAL, FAMILY, AND SOCIAL HISTORY  Past Medical  History:  Diagnosis Date  . Acquired autoimmune hypothyroidism   . Dyspepsia   . GERD (gastroesophageal reflux disease)    Pt denies  . Goiter   . Gynecomastia, male   . Heart murmur    used to have slight heart murmur as child, not anymore  . Hyperlipidemia type II   . Hypertension    "a little high"  . Isosexual precocity   . Obesity   . Prediabetes    "I used to" [age] or 23 years old  . Thyroiditis, autoimmune     Family History  Problem Relation Age of Onset  . Diabetes Mother        Gestational diabetes mellitus     Current Outpatient Medications:  .  SYNTHROID 50 MCG tablet, TAKE 1 TABLET BY MOUTH EVERY DAY, Disp: 30 tablet, Rfl: 5 .  ibuprofen (ADVIL,MOTRIN) 800 MG tablet, Take 1 tablet (800 mg total) by mouth every 8 (eight) hours as needed. (Patient not taking: Reported on 02/25/2019), Disp: 30 tablet, Rfl: 0   reports that he has never smoked. He has never used smokeless tobacco. He reports that he does not  drink alcohol or use drugs. Pediatric History  Patient Parents/Guardians  . Ringley,Wanda (Mother/Guardian)  . Bostock,Darin (Father)   Other Topics Concern  . Not on file  Social History Narrative  . Not on file   1. School and Family: The patient graduated from [employer] in the Spring of 2020. 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  2. Activities: He lifts weights 3 times per week and does cardio once a week.. 3. Primary Care Provider: Dr. Lupe Carney, [facility]  REVIEW OF SYSTEMS: There are no other significant problems involving Flemon's other body systems.   Objective:  Vital Signs:  BP 132/80   Pulse 78   Ht 5' 9.21" (1.758 m)   Wt 176 lb 6.4 oz (80 kg)   BMI 25.89 kg/m    Ht Readings from Last 3 Encounters:  10/14/19 5' 9.21" (1.758 m)  02/25/19 5' 9.29" (1.76 m)  11/13/18 5\' 10"  (1.778 m)   Wt Readings from Last 3 Encounters:  10/14/19 176 lb 6.4 oz (80 kg)  02/25/19 176 lb (79.8 kg)  11/13/18 173  lb 1.6 oz (78.5 kg)   Body surface area is 1.98 meters squared.  PHYSICAL EXAM:  Constitutional: The patient appears healthy and much older and more mature than his chronologic age. He is definitely quite muscular. His height growth has ceased. His weight has remained the same. He is 10% above his ideal Body Weight of 160. He is alert and bright. His affect and insight are normal.    Head: The head is normocephalic. Face: The face appears normal. There are no obvious dysmorphic features. Eyes: The eyes appear to be normally formed and spaced. Gaze is conjugate. There is no obvious arcus or proptosis. Moisture appears normal. Ears: The ears are normally placed and appear externally normal. Mouth: The oropharynx and tongue appear normal. Dentition appears to be normal for age. Oral moisture is normal. Neck: The neck is visibly enlarged. No carotid bruits are noted. The strap muscles are still quite large. His thyroid is smaller at about 21 grams in size.  The lobes are symmetrically enlarged.  The isthmus is not enlarged today. The consistency of the thyroid gland is normal. The thyroid gland is not tender to palpation. Lungs: The lungs are clear to auscultation. Air movement is good. Heart: Heart rate and rhythm are regular. Heart sounds S1 and S2 are normal. He has grade 1/6 systolic flow murmur that sounds benign. I did not appreciate any pathologic cardiac murmurs. Abdomen: The abdomen is normal in size for the patient's age. He probably has less abdominal fat than at his last visit. Bowel sounds are normal. There is no obvious hepatomegaly, splenomegaly, or other mass effect.  Arms: Muscle size and bulk are normal for age. Hands: There is a trace tremor today. Phalangeal and metacarpophalangeal joints are normal. Palmar muscles are normal for age. He has no palmar erythema. He has no excess palmar moisture.  Legs: Muscles appear normal for age. No edema is present. Neurologic: Strength is  normal for age in both the upper and lower extremities. Muscle tone is normal. Sensation to touch is normal in both legs.   Breast tissue: Breasts are Tanner stage I. Areolae measure 25 mm on the right and 30 mm on the left, compared with 25 mm on the right and 30 mm on the left at his last visit. I do not feel breast buds.  Skin: He has several old, faded, pale striae  at his flanks.    LAB DATA:   Labs 10/13/10: pending  Labs 02/25/19; TSH 1.96, free T4 1.4, free T3 3.8  Labs 08/26/18: HbA1c 5.1%; TSH 1.42, free T4 1.3, free T3 4.2  Labs 02/20/18: HbA1c 5.1%, CBG 95; TSH 1.65, free T4 1.4, free T3 3.6  Labs 07/05/17: HbA1c 4.9%, CBG 159; TSH 1.64, free T4 1.3, free T3 3.6  Labs 11/21/16: TSH 0.79, free T4 1.2, free T3 3.5  Lab s 04/24/16: TSH 3.49, free T4 1.0, free T3 3.4 after missing several Synthroid doses  Labs 10/23/15: TSH 1.657, free T4 1.20, free T3 3.5  Labs 05/31/15: HbA1c 5.1%; TSH 2.318, free T4 1.26, free T3 3.9  Labs 11/09/14: 24-hour urine free cortisol 3.6, compared with 65.9 three months previously. 24-hour urine creatinine was 2090; TSH 2.836, free T4 1.04, free T3 4.0  Labs 08/22/14: HbA1c 5.6%, compared with 5.1% art last visit and with 4.4% two years ago. TSH 2.675, free T4 1.18, free T3 4.4  Labs 02/24/13: TSH 1.986, free T4 1.17, free T3 3.7   Labs 12/28/11: TSH 2.544, Free T4 1.49, Free T3 4.1   Labs 05/13/11: TSH 1.642. Free T4 1.21. Free T3 4.0.   Assessment and Plan:   ASSESSMENT:  1.  Hypothyroid:   A. The patient was euthyroid in January 2013, in March 2014, in September 2015, and in December 2015, although his TSH had been >2.0. In November 2016 his TFTs were mid-range normal. In May 2017, however, his TSH was elevated after missing many Synthroid doses. I asked him to take the medication daily. His TFTs in December 2017 were at about the 80% of the normal range. His TFTs in August 2018, in March 2019, in September 2019, and in March 2020 were  mid-euthyroid. We will repeat his TFTs today.   B. His treatment TSH goal is between 1.0-2.0.  C. His current Synthroid dose was good in March 2020, but was still relatively low for a young man of his age. He still has many thyrocytes that are functional, thyrocytes that are subject to further attack by his Hashimoto's T lymphocytes. He will likely lose more thyrocytes over time and require higher replacement doses of Synthroid.   2.  Thyroiditis: His thyroid inflammation is clinically quiescent. The shift of all three TFTs upward or downward together that he has had in the past is pathognomonic for a flare up of Hashimoto's disease.  3.  Goiter: Thyroid gland is a bit smaller than it was at his last visit. The process of waxing and waning of the thyroid gland size is consistent with episodes of thyroid inflammation. 4. Overweight: Adit is muscular. He is not overweight. 5. Hypertension: His SBP is good today, but his DBP is still a bit high.    6. Dyspepsia: This problem is mildly active, but not significant.      7. Pre-diabetes: His A1c was mid-range normal in March and in November 2019. He is not "pre-diabetic" any longer in a clinical sense. Unless he gains a lot of fat weight there will not be any need to follow this problem.  8. Striae: His striae have essentially resolved.   PLAN:  1. Diagnostic: TFTs today and again in 6 months at his next visit. 2. Therapeutic:  Continue current dose of Synthroid. Try to fit in daily cardio exercise, but at least 3 times per week. I encouraged him to drink more water. If his belly hunger worsens he may need to begin treatment with  omeprazole.  3. Patient education: We talked about the need to continue to eat right and to exercise. We also talked about the natural course of Hashimoto's disease and hypothyroidism. It is likely that he will become progressively more hypothyroid over time. Given his family history of hypertension it would be prudent to do more  cardio.  4. Follow-up: 6 months. Call if having more dyspepsia.  Level of Service: This visit lasted in excess of 45 minutes. More than 50% of the visit was devoted to counseling.  Tillman Sers, MD, CDE Adult and Pediatric Endocrinology

## 2019-10-15 LAB — TSH: TSH: 1.72 mIU/L (ref 0.40–4.50)

## 2019-10-15 LAB — T4, FREE: Free T4: 1.4 ng/dL (ref 0.8–1.8)

## 2019-10-15 LAB — T3, FREE: T3, Free: 4 pg/mL (ref 2.3–4.2)

## 2019-10-27 ENCOUNTER — Encounter (INDEPENDENT_AMBULATORY_CARE_PROVIDER_SITE_OTHER): Payer: Self-pay | Admitting: *Deleted

## 2019-11-09 DIAGNOSIS — Z20828 Contact with and (suspected) exposure to other viral communicable diseases: Secondary | ICD-10-CM | POA: Diagnosis not present

## 2019-12-23 ENCOUNTER — Other Ambulatory Visit (INDEPENDENT_AMBULATORY_CARE_PROVIDER_SITE_OTHER): Payer: Self-pay | Admitting: "Endocrinology

## 2019-12-23 DIAGNOSIS — E034 Atrophy of thyroid (acquired): Secondary | ICD-10-CM

## 2020-04-13 ENCOUNTER — Ambulatory Visit (INDEPENDENT_AMBULATORY_CARE_PROVIDER_SITE_OTHER): Payer: BC Managed Care – PPO | Admitting: "Endocrinology

## 2020-05-11 ENCOUNTER — Encounter (INDEPENDENT_AMBULATORY_CARE_PROVIDER_SITE_OTHER): Payer: Self-pay | Admitting: "Endocrinology

## 2020-05-11 ENCOUNTER — Ambulatory Visit (INDEPENDENT_AMBULATORY_CARE_PROVIDER_SITE_OTHER): Payer: BC Managed Care – PPO | Admitting: "Endocrinology

## 2020-05-11 ENCOUNTER — Other Ambulatory Visit: Payer: Self-pay

## 2020-05-11 VITALS — BP 124/78 | HR 76 | Wt 172.8 lb

## 2020-05-11 DIAGNOSIS — E049 Nontoxic goiter, unspecified: Secondary | ICD-10-CM

## 2020-05-11 DIAGNOSIS — E063 Autoimmune thyroiditis: Secondary | ICD-10-CM | POA: Diagnosis not present

## 2020-05-11 DIAGNOSIS — I10 Essential (primary) hypertension: Secondary | ICD-10-CM

## 2020-05-11 DIAGNOSIS — R7303 Prediabetes: Secondary | ICD-10-CM

## 2020-05-11 DIAGNOSIS — R1013 Epigastric pain: Secondary | ICD-10-CM

## 2020-05-11 LAB — POCT GLYCOSYLATED HEMOGLOBIN (HGB A1C): Hemoglobin A1C: 4.8 % (ref 4.0–5.6)

## 2020-05-11 LAB — POCT GLUCOSE (DEVICE FOR HOME USE): POC Glucose: 116 mg/dl — AB (ref 70–99)

## 2020-05-11 NOTE — Patient Instructions (Signed)
Follow up visit in 6 months. 

## 2020-05-11 NOTE — Progress Notes (Signed)
Subjective:  Patient Name: Jeremy Duncan Date of Birth: 1996/02/01  MRN: [id number]  Jeremy Duncan  presents to the office today for follow-up of his acquired hypothyroidism, Hashimoto's disease, goiter, obesity, hyperlipidemia, hypertension, prediabetes, gynecomastia, dyspepsia, and GERD.  HISTORY OF PRESENT ILLNESS:   Jeremy Duncan is a 24 y.o. Caucasian young man. Jeremy Duncan.  1. Jeremy Duncan was first referred to me on 10/21/07 by his PCP, Dr[facility] Victorino [facility]ennifer [facility], for evaluation and management of hypothyroidism. He was 11 years and 7 months old at the time.  A. The child was the product of an uncomplicated pregnancy. His mother had gestational diabetes mellitus. He was delivered by C-section. Birth weight was 6 lbs. 4 oz. He was a healthy newborn. He was also a healthy infant. Between the ages of 89 and 9 he experienced a marked increase in weight. One year he gained 20 pounds. He also developed hypertension. Laboratory tests on 06/13/07 showed a TSH of 6.09. Repeat studies on 06/17/07 showed a TSH of 6.283. Free T4 was 1.22. Dr. Vaughan BastaSummer called me and I suggested that she start him on Synthroid 25 mcg per day. Thyroid function tests on 07/10/2007 showed that the TSH decreased to 4.055. I suggested that the Synthroid dose be increased to 37.5 mcg per day.   B. The patient's pertinent review of systems revealed that he was always hungry. He had been placed on Prevacid but was only taking it as needed. Previous laboratory tests had shown hypercholesterolemia and hyperinsulinemia. A fasting insulin value was 20 when the accompanying blood glucose was 85. Family history was positive for a maternal great-grandmother who took thyroid pills. It was believed that she had never had surgery or radiation to her neck. That same maternal great-grandmother had had cancer of her breast and bone. Both the father and mother had significant stomach acid issues.There was a family history of  overweight/obesity. [Addendum 08/26/18: There is a history of high BP on his mom's side of the family.]  C. On physical examination his height was at the 75th percentile. His weight was at the 97th percentile. BMI was greater than the 97th percentile. His blood pressure was 131/86. He was definitely obese. He had a 12 gm goiter. His right lobe was tender to palpation. Breast tissue was early Tanner stage II. The right areola was 22 mm. The left areola was 25 mm. He had Tanner stage II pubic hair. Testes were to 2-3 mL in volume. Lab tests that day showed a normal CMP. His TSH was 2.773. His free T4 was 1.14. His free T3 was 1.3. FSH was 2.8 and LH 0.4. Testosterone was 25.3. Estradiol was less than 10. Androstenedione was 65.  D. It appeared at that time that the patient had acquired primary hypothyroidism secondary to Hashimoto's disease. His thyroid gland was definitely tender, consistent with a flare-up of Hashimoto's disease at that time. He was obese, in part due to extreme hunger caused by dyspepsia. Family history was definitely positive for dyspepsia and overweight/obesity. Previous labs had shown hyperinsulinemia. Therefore, it was predictable that his testosterone would be slightly elevated for age. I felt that his hypertension was most likely a result of his obesity and that if he could lose enough weight, the hypertension would likely resolve as well. I started the child on metformin, 500 mg twice daily. I discussed with the family our Eat Right diet plan and our Exercise Right plan.  2. During the past 12 years, Jeremy Duncan has lost some additional thyroid  cells. As a result, I increased his Synthroid dose to 50 mcg/day. Since then he has remained euthyroid. During the first year, his weight dropped from the 97th to the 75th percentile, but then gradually rose again to about the 82nd percentile. He had a growth spurt at age 89. When his weight decreased to the 75th percentile, his blood pressure decreased  to 111/69. When the weight then increased to the 82nd percentile, the blood pressure increased to 125/82. His weight percentile peaked at 88% in October 2013, in part due to a large increase in muscle mass. The gynecomastia has gradually resolved over time.   3. The patient's last [facility] visit was on 10/14/19. I continued his current Synthroid dose.  A. In the interim, he has been healthy.    B. He takes his Synthroid, 50 mcg once daily.     4. Pertinent Review of Systems:  Constitutional: Jeremy Duncan feels "pretty good". Energy level is "good". He is active. Eyes: His distance vision has deteriorated a bit. There are no other recognized eye problems. Neck: The patient has no complaints of anterior neck swelling, soreness, tenderness, pressure, discomfort, or difficulty swallowing.   Heart: Heart rate increases with exercise or other physical activity. The patient has no complaints of palpitations, irregular heart beats, chest pain, or chest pressure.   Gastrointestinal: He probably has about the same amount of belly hunger that he did at his last visit. Bowel movents seem normal. The patient has no complaints of acid reflux, upset stomach, stomach aches or pains, diarrhea, or constipation.  Hands: He texts and plays video games quite well. He still has his baseline fine tremor.  Legs: Muscle mass and strength seem normal. There are no complaints of numbness, tingling, burning, or pain. No edema is noted.  Feet: There are no obvious foot problems. There are no complaints of numbness, tingling, burning, or pain. No edema is noted. Neurologic: There are no recognized problems with muscle movement and strength, sensation, or coordination. Breasts: Breast tissue is smaller.   Skin: His striae have mostly faded.       PAST MEDICAL, FAMILY, AND SOCIAL HISTORY  Past Medical History:  Diagnosis Date  . Acquired autoimmune hypothyroidism   . Dyspepsia   . GERD (gastroesophageal reflux disease)    Pt denies   . Goiter   . Gynecomastia, male   . Heart murmur    used to have slight heart murmur as child, not anymore  . Hyperlipidemia type II   . Hypertension    "a little high"  . Isosexual precocity   . Obesity   . Prediabetes    "I used to" 7 or 24 years old  . Thyroiditis, autoimmune     Family History  Problem Relation Age of Onset  . Diabetes Mother        Gestational diabetes mellitus     Current Outpatient Medications:  .  SYNTHROID 50 MCG tablet, TAKE 1 TABLET BY MOUTH EVERY DAY, Disp: 30 tablet, Rfl: 5 .  ibuprofen (ADVIL,MOTRIN) 800 MG tablet, Take 1 tablet (800 mg total) by mouth every 8 (eight) hours as needed. (Patient not taking: Reported on 02/25/2019), Disp: 30 tablet, Rfl: 0   reports that he has never smoked. He has never used smokeless tobacco. He reports that he does not drink alcohol or use drugs. Pediatric History  Patient Parents/Guardians  . Doebler,Wanda (Mother/Guardian)  . Croll,Darin (Father)   Other Topics Concern  . Not on file  Social History Narrative  .  Not on file   1. School and Family: The patient graduated from [employer] in the Spring of 2020. 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 He still lives with his mother.  2. Activities: He lifts weights 3 times per week and does cardio once a week.. 3. Primary Care Provider: Dr. Donnie Coffin, [facility]  REVIEW OF SYSTEMS: There are no other significant problems involving Michai's other body systems.   Objective:  Vital Signs:  BP 124/78   Pulse 76   Wt 172 lb 12.8 oz (78.4 kg)   BMI 25.36 kg/m    Ht Readings from Last 3 Encounters:  10/14/19 5' 9.21" (1.758 m)  02/25/19 5' 9.29" (1.76 m)  11/13/18 5\' 10"  (1.778 m)   Wt Readings from Last 3 Encounters:  05/11/20 172 lb 12.8 oz (78.4 kg)  10/14/19 176 lb 6.4 oz (80 kg)  02/25/19 176 lb (79.8 kg)   Body surface area is 1.96 meters squared.  PHYSICAL  EXAM:  Constitutional: The patient appears healthy and quite muscular. His height growth has ceased. His weight has decreased 4 pounds. He is about 8% above his ideal Body Weight of 160. He is alert and bright. His affect and insight are normal.    Head: The head is normocephalic. Face: The face appears normal. There are no obvious dysmorphic features. Eyes: The eyes appear to be normally formed and spaced. Gaze is conjugate. There is no obvious arcus or proptosis. Moisture appears normal. Ears: The ears are normally placed and appear externally normal. Mouth: The oropharynx and tongue appear normal. Dentition appears to be normal for age. Oral moisture is normal. Neck: The neck is visibly enlarged. No carotid bruits are noted. The strap muscles are still quite large. His thyroid is again about 21 grams in size. Today the right lobe has shrunk back to top-normal size, but the left lobe is larger. The isthmus is not enlarged today. The consistency of the thyroid gland is normal. The thyroid gland is not tender to palpation. Lungs: The lungs are clear to auscultation. Air movement is good. Heart: Heart rate and rhythm are regular. Heart sounds S1 and S2 are normal. He has grade 1/6 systolic flow murmur that sounds benign. I did not appreciate any pathologic cardiac murmurs. Abdomen: The abdomen is normal in size for the patient's age. He probably has less abdominal fat than at his last visit. Bowel sounds are normal. There is no obvious hepatomegaly, splenomegaly, or other mass effect.  Arms: Muscle size and bulk are normal for age. Hands: There is a trace tremor today. Phalangeal and metacarpophalangeal joints are normal. Palmar muscles are normal for age. He has no palmar erythema. He has no excess palmar moisture.  Legs: Muscles appear normal for age. No edema is present. Neurologic: Strength is normal for age in both the upper and lower extremities. Muscle tone is normal. Sensation to touch is  normal in both legs.   Breast tissue: Breasts are Tanner stage I. Areolae measure 25 mm on the right and 25 mm on the left, compared to 25 mm on the right and 30 mm on the left at his last visit and with 25 mm on the right and 30 mm on the left at his prior visit. I do not feel breast buds.  Skin: He has several old, faded, pale striae at his flanks.    LAB DATA:   Labs 05/11/20: HbA1c 4.8%,  CBG 116 after coffee; TFTs pending  Labs 10/14/19: TSH 1.72, free T4 1.4, free T3 4.0  Labs 02/25/19; TSH 1.96, free T4 1.4, free T3 3.8  Labs 08/26/18: HbA1c 5.1%; TSH 1.42, free T4 1.3, free T3 4.2  Labs 02/20/18: HbA1c 5.1%, CBG 95; TSH 1.65, free T4 1.4, free T3 3.6  Labs 07/05/17: HbA1c 4.9%, CBG 159; TSH 1.64, free T4 1.3, free T3 3.6  Labs 11/21/16: TSH 0.79, free T4 1.2, free T3 3.5  Lab s 04/24/16: TSH 3.49, free T4 1.0, free T3 3.4 after missing several Synthroid doses  Labs 10/23/15: TSH 1.657, free T4 1.20, free T3 3.5  Labs 05/31/15: HbA1c 5.1%; TSH 2.318, free T4 1.26, free T3 3.9  Labs 11/09/14: 24-hour urine free cortisol 3.6, compared with 65.9 three months previously. 24-hour urine creatinine was 2090; TSH 2.836, free T4 1.04, free T3 4.0  Labs 08/22/14: HbA1c 5.6%, compared with 5.1% art last visit and with 4.4% two years ago. TSH 2.675, free T4 1.18, free T3 4.4  Labs 02/24/13: TSH 1.986, free T4 1.17, free T3 3.7   Labs 12/28/11: TSH 2.544, Free T4 1.49, Free T3 4.1   Labs 05/13/11: TSH 1.642. Free T4 1.21. Free T3 4.0.   Assessment and Plan:   ASSESSMENT:  1.  Hypothyroid:   A. The patient was euthyroid in January 2013, in March 2014, in September 2015, and in December 2015, although his TSH had been >2.0. In November 2016 his TFTs were mid-range normal. In May 2017, however, his TSH was elevated after missing many Synthroid doses. I asked him to take the medication daily. His TFTs in December 2017 were at about the 80% of the normal range. His TFTs in August 2018, in March  2019, in September 2019, and in March and November 2020 were mid-euthyroid. We will repeat his TFTs today.   B. His treatment TSH goal is between 1.0-2.0.  C. His current Synthroid dose was good in March and November 2020, but was still relatively low for a young man of his age. He still has many thyrocytes that are functional, thyrocytes that are subject to further attack by his Hashimoto's T lymphocytes. He will likely lose more thyrocytes over time and require higher replacement doses of Synthroid.  D. We will draw his TFTs today and call him with the results.   2.  Thyroiditis: His thyroid inflammation is clinically quiescent. The shift of all three TFTs upward or downward together that he has had in the past is pathognomonic for a flare up of Hashimoto's disease.  3.  Goiter: Thyroid gland is about the same size overall, but the lobes have shifted in size again. The process of waxing and waning of the thyroid gland size is consistent with episodes of thyroid inflammation. 4. Overweight: Jeremy Duncan is muscular. He is not overweight. 5. Hypertension: His SBP is good today, but his DBP is still a bit high. He needs more cardio.  6. Dyspepsia: This problem is mildly active, but not significant.      7. Pre-diabetes:   A. His A1c was mid-range normal in March and in September 2019. His Hba1c today is in the lower half of the adult normal range and is the lowest that is has been in 9 years.   B. He is not "pre-diabetic" any longer in a clinical sense. Unless he gains a lot of fat weight there will not be any need to follow this problem.  8. Striae: His striae have essentially resolved.  PLAN:  1. Diagnostic: TFTs today and again in 6 months at his next visit. 2. Therapeutic:  Continue current dose of Synthroid, but adjust the dose as needed. Try to fit in daily cardio exercise, but at least 3 times per week. I encouraged him to drink more water. If his belly hunger worsens he may need to begin treatment  with omeprazole.  3. Patient education: We talked about the need to continue to eat right and to exercise. We also talked about the natural course of Hashimoto's disease and hypothyroidism. It is likely that he will become progressively more hypothyroid over time. Given his family history of hypertension it would be prudent to do more cardio.  4. Follow-up: 6 months. Call if having more dyspepsia.  Level of Service: This visit lasted in excess of 40 minutes. More than 50% of the visit was devoted to counseling.  Molli Knock, MD, CDE Adult and Pediatric Endocrinology

## 2020-05-12 LAB — TSH: TSH: 1.89 mIU/L (ref 0.40–4.50)

## 2020-05-12 LAB — T3, FREE: T3, Free: 3.9 pg/mL (ref 2.3–4.2)

## 2020-05-12 LAB — T4, FREE: Free T4: 1.4 ng/dL (ref 0.8–1.8)

## 2020-05-24 ENCOUNTER — Encounter (INDEPENDENT_AMBULATORY_CARE_PROVIDER_SITE_OTHER): Payer: Self-pay

## 2020-06-23 ENCOUNTER — Other Ambulatory Visit (INDEPENDENT_AMBULATORY_CARE_PROVIDER_SITE_OTHER): Payer: Self-pay | Admitting: "Endocrinology

## 2020-06-23 DIAGNOSIS — E034 Atrophy of thyroid (acquired): Secondary | ICD-10-CM

## 2020-07-20 DIAGNOSIS — R590 Localized enlarged lymph nodes: Secondary | ICD-10-CM | POA: Diagnosis not present

## 2020-11-13 DIAGNOSIS — Z20822 Contact with and (suspected) exposure to covid-19: Secondary | ICD-10-CM | POA: Diagnosis not present

## 2020-11-16 ENCOUNTER — Ambulatory Visit (INDEPENDENT_AMBULATORY_CARE_PROVIDER_SITE_OTHER): Payer: BC Managed Care – PPO | Admitting: "Endocrinology

## 2021-01-06 ENCOUNTER — Other Ambulatory Visit: Payer: Self-pay

## 2021-01-06 ENCOUNTER — Ambulatory Visit (INDEPENDENT_AMBULATORY_CARE_PROVIDER_SITE_OTHER): Payer: BC Managed Care – PPO | Admitting: "Endocrinology

## 2021-01-06 ENCOUNTER — Encounter (INDEPENDENT_AMBULATORY_CARE_PROVIDER_SITE_OTHER): Payer: Self-pay | Admitting: "Endocrinology

## 2021-01-06 VITALS — BP 118/72 | HR 84 | Wt 184.6 lb

## 2021-01-06 DIAGNOSIS — I10 Essential (primary) hypertension: Secondary | ICD-10-CM

## 2021-01-06 DIAGNOSIS — R7303 Prediabetes: Secondary | ICD-10-CM | POA: Diagnosis not present

## 2021-01-06 DIAGNOSIS — E063 Autoimmune thyroiditis: Secondary | ICD-10-CM

## 2021-01-06 DIAGNOSIS — E049 Nontoxic goiter, unspecified: Secondary | ICD-10-CM

## 2021-01-06 LAB — POCT GLYCOSYLATED HEMOGLOBIN (HGB A1C): Hemoglobin A1C: 5.1 % (ref 4.0–5.6)

## 2021-01-06 LAB — POCT GLUCOSE (DEVICE FOR HOME USE): POC Glucose: 99 mg/dl (ref 70–99)

## 2021-01-06 NOTE — Patient Instructions (Signed)
Follow up visit in 6 months. 

## 2021-01-06 NOTE — Progress Notes (Signed)
Subjective:  Patient Name: Jeremy Duncan Date of Birth: 1996-03-27  MRN: [id number]  Jeremy Duncan  presents to the office today for follow-up of his acquired hypothyroidism, Hashimoto's disease, goiter, obesity, hyperlipidemia, hypertension, prediabetes, gynecomastia, dyspepsia, and GERD.  HISTORY OF PRESENT ILLNESS:   Jeremy Duncan is a 25 y.o. Caucasian young man. Jeremy Duncan was unaccompanied.  1. Jeremy Duncan was first referred to me on 10/21/07 by his PCP, Dr. Victorino Dike Summer of [facility], for evaluation and management of hypothyroidism. He was 11 years and 7 months old at the time.  A. The child was the product of an uncomplicated pregnancy. His mother had gestational diabetes mellitus. He was delivered by C-section. Birth weight was 6 lbs. 4 oz. He was a healthy newborn. He was also a healthy infant. Between the ages of 19 and 9 he experienced a marked increase in weight. One year he gained 20 pounds. He also developed hypertension. Laboratory tests on 06/13/07 showed a TSH of 6.09. Repeat studies on 06/17/07 showed a TSH of 6.283. Free T4 was 1.22. Dr. Vaughan Basta called me and I suggested that she start him on Synthroid 25 mcg per day. Thyroid function tests on 07/10/2007 showed that the TSH decreased to 4.055. I suggested that the Synthroid dose be increased to 37.5 mcg per day.   B. The patient's pertinent review of systems revealed that he was always hungry. He had been placed on Prevacid but was only taking it as needed. Previous laboratory tests had shown hypercholesterolemia and hyperinsulinemia. A fasting insulin value was 20 when the accompanying blood glucose was 85. Family history was positive for a maternal great-grandmother who took thyroid pills. It was believed that she had never had surgery or radiation to her neck. That same maternal great-grandmother had had cancer of her breast and bone. Both the father and mother had significant stomach acid issues.There was a family history of  overweight/obesity. [Addendum 08/26/18: There is a history of high BP on his mom's side of the family.]  C. On physical examination his height was at the 75th percentile. His weight was at the 97th percentile. BMI was greater than the 97th percentile. His blood pressure was 131/86. He was definitely obese. He had a 12 gm goiter. His right lobe was tender to palpation. Breast tissue was early Tanner stage II. The right areola was 22 mm. The left areola was 25 mm. He had Tanner stage II pubic hair. Testes were to 2-3 mL in volume. Lab tests that day showed a normal CMP. His TSH was 2.773. His free T4 was 1.14. His free T3 was 1.3. FSH was 2.8 and LH 0.4. Testosterone was 25.3. Estradiol was less than 10. Androstenedione was 65.  D. It appeared at that time that the patient had acquired primary hypothyroidism secondary to Hashimoto's disease. His thyroid gland was definitely tender, consistent with a flare-up of Hashimoto's disease at that time. He was obese, in part due to extreme hunger caused by dyspepsia. Family history was definitely positive for dyspepsia and overweight/obesity. Previous labs had shown hyperinsulinemia. Therefore, it was predictable that his testosterone would be slightly elevated for age. I felt that his hypertension was most likely a result of his obesity and that if he could lose enough weight, the hypertension would likely resolve as well. I started the child on metformin, 500 mg twice daily. I discussed with the family our Eat Right diet plan and our Exercise Right plan.  2. During the past 13 years, Jeremy Duncan has lost some additional thyroid  cells. As a result, I increased his Synthroid dose to 50 mcg/day. Since then he has remained euthyroid. During the first year, his weight dropped from the 97th to the 75th percentile, but then gradually rose again to about the 82nd percentile. He had a growth spurt at age 61. When his weight decreased to the 75th percentile, his blood pressure decreased  to 111/69. When the weight then increased to the 82nd percentile, the blood pressure increased to 125/82. His weight percentile peaked at 88% in October 2013, in part due to a large increase in muscle mass. The gynecomastia has gradually resolved over time.   3. The patient's last [facility] visit was on 05/11/20. I continued his current Synthroid dose.  A. In the interim, he has been healthy.    B. He takes his Synthroid, 50 mcg once daily.     4. Pertinent Review of Systems:  Constitutional: Jeremy Duncan feels "pretty good". Energy level is "good". He is active. Eyes: His distance vision has deteriorated a bit over time. He has glasses for distant vision. There are no other recognized eye problems. Neck: The patient has no complaints of anterior neck swelling, soreness, tenderness, pressure, discomfort, or difficulty swallowing.   Heart: Heart rate increases with exercise or other physical activity. The patient has no complaints of palpitations, irregular heart beats, chest pain, or chest pressure.   Gastrointestinal: He probably has about the same amount of belly hunger that he did at his last visit. Bowel movents seem normal. The patient has no complaints of acid reflux, upset stomach, stomach aches or pains, diarrhea, or constipation.  Hands: He texts and plays video games quite well. He still has his baseline fine tremor.  Legs: Muscle mass and strength seem normal. There are no complaints of numbness, tingling, burning, or pain. No edema is noted.  Feet: There are no obvious foot problems. There are no complaints of numbness, tingling, burning, or pain. No edema is noted. Neurologic: There are no recognized problems with muscle movement and strength, sensation, or coordination. Breasts: Breast tissue is "a little bit larger".  Skin: His striae have mostly faded.       PAST MEDICAL, FAMILY, AND SOCIAL HISTORY  Past Medical History:  Diagnosis Date  . Acquired autoimmune hypothyroidism   . Dyspepsia    . GERD (gastroesophageal reflux disease)    Pt denies  . Goiter   . Gynecomastia, male   . Heart murmur    used to have slight heart murmur as child, not anymore  . Hyperlipidemia type II   . Hypertension    "a little high"  . Isosexual precocity   . Obesity   . Prediabetes    "I used to" 34 or 25 years old  . Thyroiditis, autoimmune     Family History  Problem Relation Age of Onset  . Diabetes Mother        Gestational diabetes mellitus     Current Outpatient Medications:  .  SYNTHROID 50 MCG tablet, TAKE 1 TABLET BY MOUTH EVERY DAY, Disp: 90 tablet, Rfl: 1 .  ibuprofen (ADVIL,MOTRIN) 800 MG tablet, Take 1 tablet (800 mg total) by mouth every 8 (eight) hours as needed. (Patient not taking: No sig reported), Disp: 30 tablet, Rfl: 0   reports that he has never smoked. He has never used smokeless tobacco. He reports that he does not drink alcohol and does not use drugs. Pediatric History  Patient Parents/Guardians  . Wortley,Wanda (Mother/Guardian)  . Ivins,Darin (Father)   Other  Topics Concern  . Not on file  Social History Narrative  . Not on file   1. School and Family: The patient graduated from [employer] in the Spring of 2020. 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 He still lives with his mother, but will get married soon and move to a new apartment..  2. Activities: He lifts weights 4-5 times per week and does cardio occasionally.  3. Primary Care Provider: Dr. Lupe Carney, [facility]  REVIEW OF SYSTEMS: There are no other significant problems involving Chauncy's other body systems.   Objective:  Vital Signs:  BP 118/72   Pulse 84   Wt 184 lb 9.6 oz (83.7 kg)   BMI 27.09 kg/m    Ht Readings from Last 3 Encounters:  10/14/19 5' 9.21" (1.758 m)  02/25/19 5' 9.29" (1.76 m)  11/13/18 5\' 10"  (1.778 m)   Wt Readings from Last 3 Encounters:  01/06/21 184 lb 9.6 oz (83.7 kg)  05/11/20  172 lb 12.8 oz (78.4 kg)  10/14/19 176 lb 6.4 oz (80 kg)   Body surface area is 2.02 meters squared.  PHYSICAL EXAM:  Constitutional: The patient appears healthy and quite muscular. His height growth has ceased. His weight has increased 12 pounds, equivalent to a net gain of 1.5 pounds per month = 165 excess calories per day. He is about 15% above his ideal Body Weight of 160. He is alert and bright. His affect and insight are normal.    Head: The head is normocephalic. Face: The face appears normal. There are no obvious dysmorphic features. Eyes: The eyes appear to be normally formed and spaced. Gaze is conjugate. There is no obvious arcus or proptosis. Moisture appears normal. Ears: The ears are normally placed and appear externally normal. Mouth: The oropharynx and tongue appear normal. Dentition appears to be normal for age. Oral moisture is normal. Neck: The neck is visibly enlarged. No carotid bruits are noted. The strap muscles are still quite large. His thyroid is again about 21 grams in size. Today the lobes are symmetrically enlarged. The isthmus is not enlarged today. The consistency of the thyroid gland is normal. The thyroid gland is not tender to palpation. Lungs: The lungs are clear to auscultation. Air movement is good. Heart: Heart rate and rhythm are regular. Heart sounds S1 and S2 are normal. He has grade 1/6 systolic flow murmur that sounds benign. I did not appreciate any pathologic cardiac murmurs. Abdomen: The abdomen is normal in size for the patient's age. He has more abdominal fat than at his last visit. Bowel sounds are normal. There is no obvious hepatomegaly, splenomegaly, or other mass effect.  Arms: Muscle size and bulk are normal for age. Hands: There is a trace tremor today. Phalangeal and metacarpophalangeal joints are normal. Palmar muscles are normal for age. He has no palmar erythema. He has no excess palmar moisture.  Legs: Muscles appear normal for age. No  edema is present. Neurologic: Strength is normal for age in both the upper and lower extremities. Muscle tone is normal. Sensation to touch is normal in both legs.   Breast tissue: Breasts are Tanner stage I. Areolae measure 30 mm on the right and 31 mm on the left, compared with 25 mm on the right and 25 mm on the left at his last visit, and with 25 mm on the right and 30 mm on the left at his prior  visit.  Skin: He has several old, faded, pale striae at his flanks.    LAB DATA:   Labs 01/06/21: HbA1c 5.1%, CBG 99  Labs 05/11/20: HbA1c 4.8%, CBG 116 after coffee; TSH 1.89, free T4 1.4, free T3 3.9  Labs 10/14/19: TSH 1.72, free T4 1.4, free T3 4.0  Labs 02/25/19; TSH 1.96, free T4 1.4, free T3 3.8  Labs 08/26/18: HbA1c 5.1%; TSH 1.42, free T4 1.3, free T3 4.2  Labs 02/20/18: HbA1c 5.1%, CBG 95; TSH 1.65, free T4 1.4, free T3 3.6  Labs 07/05/17: HbA1c 4.9%, CBG 159; TSH 1.64, free T4 1.3, free T3 3.6  Labs 11/21/16: TSH 0.79, free T4 1.2, free T3 3.5  Lab s 04/24/16: TSH 3.49, free T4 1.0, free T3 3.4 after missing several Synthroid doses  Labs 10/23/15: TSH 1.657, free T4 1.20, free T3 3.5  Labs 05/31/15: HbA1c 5.1%; TSH 2.318, free T4 1.26, free T3 3.9  Labs 11/09/14: 24-hour urine free cortisol 3.6, compared with 65.9 three months previously. 24-hour urine creatinine was 2090; TSH 2.836, free T4 1.04, free T3 4.0  Labs 08/22/14: HbA1c 5.6%, compared with 5.1% art last visit and with 4.4% two years ago. TSH 2.675, free T4 1.18, free T3 4.4  Labs 02/24/13: TSH 1.986, free T4 1.17, free T3 3.7   Labs 12/28/11: TSH 2.544, Free T4 1.49, Free T3 4.1   Labs 05/13/11: TSH 1.642. Free T4 1.21. Free T3 4.0.   Assessment and Plan:   ASSESSMENT:  1.  Hypothyroid:   A. The patient was euthyroid in January 2013, in March 2014, in September 2015, and in December 2015, although his TSH had been >2.0. In November 2016 his TFTs were mid-range normal. In May 2017, however, his TSH was elevated after  missing many Synthroid doses. I asked him to take the medication daily. His TFTs in December 2017 were at about the 80% of the normal range. His TFTs in August 2018, in March 2019, in September 2019, and in March and November 2020, and in June 2021 were mid-euthyroid. We will repeat his TFTs today.   B. His treatment TSH goal is between 1.0-2.0.  C. His current Synthroid dose was good in March and November 2020, but was still relatively low for a young man of his age. He still has many thyrocytes that are functional, thyrocytes that are subject to further attack by his Hashimoto's T lymphocytes. He will likely lose more thyrocytes over time and require higher replacement doses of Synthroid.  D. We will draw his TFTs today and call him with the results.   2.  Thyroiditis: His thyroid inflammation is clinically quiescent. The shift of all three TFTs upward or downward together that he has had in the past is pathognomonic for a flare up of Hashimoto's disease.  3.  Goiter: Thyroid gland is about the same size overall, but the lobes have shifted in size again. The process of waxing and waning of the thyroid gland size is consistent with episodes of thyroid inflammation. 4. Overweight: Jeremy Duncan is muscular, but he has gained weight. He is not overweight. 5. Hypertension: His BP is good today.  6. Dyspepsia: This problem is mildly active, but not significant.      7. Pre-diabetes:   A. His A1c was mid-range normal in March and in September 2019. His HbA1c today is higher and is in the upper half of the adult normal range.   B. He is not "pre-diabetic" any longer in a clinical sense. Unless  he gains a lot of fat weight there will not be any need to follow this problem.  8. Striae: His striae have essentially resolved.  9. Gynecomastia: His areolae have increased in size, paralleling his gain in fat weight.   PLAN:  1. Diagnostic: TFTs today and again in 6 months at his next visit. 2. Therapeutic:  Continue  current dose of Synthroid, but adjust the dose as needed. Try to fit in daily cardio exercise, but at least 3 times per week. I encouraged him to drink more water and cut back on carbs. If his belly hunger worsens he may need to begin treatment with omeprazole.  3. Patient education: We talked about the need to continue to eat right and to exercise. We also talked about the natural course of Hashimoto's disease and hypothyroidism. It is likely that he will become progressively more hypothyroid over time. Given his family history of hypertension it would be prudent to do more cardio.  4. Follow-up: 6 months. Call if having more dyspepsia.  Level of Service: This visit lasted in excess of 40 minutes. More than 50% of the visit was devoted to counseling.  Molli Knock, MD, CDE Adult and Pediatric Endocrinology

## 2021-01-07 LAB — TSH: TSH: 2.11 mIU/L (ref 0.40–4.50)

## 2021-01-07 LAB — T4, FREE: Free T4: 1.6 ng/dL (ref 0.8–1.8)

## 2021-01-07 LAB — T3, FREE: T3, Free: 4.1 pg/mL (ref 2.3–4.2)

## 2021-01-14 ENCOUNTER — Encounter (INDEPENDENT_AMBULATORY_CARE_PROVIDER_SITE_OTHER): Payer: Self-pay | Admitting: *Deleted

## 2021-01-19 ENCOUNTER — Other Ambulatory Visit (INDEPENDENT_AMBULATORY_CARE_PROVIDER_SITE_OTHER): Payer: Self-pay | Admitting: "Endocrinology

## 2021-01-19 DIAGNOSIS — E034 Atrophy of thyroid (acquired): Secondary | ICD-10-CM

## 2021-01-24 DIAGNOSIS — S0502XA Injury of conjunctiva and corneal abrasion without foreign body, left eye, initial encounter: Secondary | ICD-10-CM | POA: Diagnosis not present

## 2021-06-07 DIAGNOSIS — R0981 Nasal congestion: Secondary | ICD-10-CM | POA: Diagnosis not present

## 2021-06-07 DIAGNOSIS — Z1152 Encounter for screening for COVID-19: Secondary | ICD-10-CM | POA: Diagnosis not present

## 2021-06-07 DIAGNOSIS — Z20822 Contact with and (suspected) exposure to covid-19: Secondary | ICD-10-CM | POA: Diagnosis not present

## 2021-07-06 NOTE — Progress Notes (Signed)
Subjective:  Patient Name: Jeremy Duncan Date of Birth: 08-23-1996  MRN: [id number]  Jeremy Duncan  presents to the office today for follow-up of his acquired hypothyroidism, Hashimoto's disease, goiter, obesity, hyperlipidemia, hypertension, prediabetes, gynecomastia, dyspepsia, and GERD.  HISTORY OF PRESENT ILLNESS:   Jeremy Duncan is a 24 y.o. Caucasian young man. Jeremy Duncan was unaccompanied.  1. Jeremy Duncan was first referred to me on 10/21/07 by his PCP, Dr. Victorino Dike Summer of [facility], for evaluation and management of hypothyroidism. He was 11 years and 7 months old at the time.  A. The child was the product of an uncomplicated pregnancy. His mother had gestational diabetes mellitus. He was delivered by C-section. Birth weight was 6 lbs. 4 oz. He was a healthy newborn. He was also a healthy infant. Between the ages of 84 and 9 he experienced a marked increase in weight. One year he gained 20 pounds. He also developed hypertension. Laboratory tests on 06/13/07 showed a TSH of 6.09. Repeat studies on 06/17/07 showed a TSH of 6.283. Free T4 was 1.22. Dr. Vaughan Basta called me and I suggested that she start him on Synthroid 25 mcg per day. Thyroid function tests on 07/10/2007 showed that the TSH decreased to 4.055. I suggested that the Synthroid dose be increased to 37.5 mcg per day.   B. The patient's pertinent review of systems revealed that he was always hungry. He had been placed on Prevacid but was only taking it as needed. Previous laboratory tests had shown hypercholesterolemia and hyperinsulinemia. A fasting insulin value was 20 when the accompanying blood glucose was 85. Family history was positive for a maternal great-grandmother who took thyroid pills. It was believed that she had never had surgery or radiation to her neck. That same maternal great-grandmother had had cancer of her breast and bone. Both the father and mother had significant stomach acid issues.There was a family history of  overweight/obesity. [Addendum 08/26/18: There is a history of high BP on his mom's side of the family.]  C. On physical examination his height was at the 75th percentile. His weight was at the 97th percentile. BMI was greater than the 97th percentile. His blood pressure was 131/86. He was definitely obese. He had a 12 gm goiter. His right lobe was tender to palpation. Breast tissue was early Tanner stage II. The right areola was 22 mm. The left areola was 25 mm. He had Tanner stage II pubic hair. Testes were to 2-3 mL in volume. Lab tests that day showed a normal CMP. His TSH was 2.773. His free T4 was 1.14. His free T3 was 1.3. FSH was 2.8 and LH 0.4. Testosterone was 25.3. Estradiol was less than 10. Androstenedione was 65.  D. It appeared at that time that the patient had acquired primary hypothyroidism secondary to Hashimoto's disease. His thyroid gland was definitely tender, consistent with a flare-up of Hashimoto's disease at that time. He was obese, in part due to extreme hunger caused by dyspepsia. Family history was definitely positive for dyspepsia and overweight/obesity. Previous labs had shown hyperinsulinemia. Therefore, it was predictable that his testosterone would be slightly elevated for age. I felt that his hypertension was most likely a result of his obesity and that if he could lose enough weight, the hypertension would likely resolve as well. I started the child on metformin, 500 mg twice daily. I discussed with the family our Eat Right diet plan and our Exercise Right plan.  2. During the past 13 years, Jeremy Duncan has lost some additional thyroid  cells. As a result, I increased his Synthroid dose to 50 mcg/day. Since then he has remained euthyroid. During the first year, his weight dropped from the 97th to the 75th percentile, but then gradually rose again to about the 82nd percentile. He had a growth spurt at age 84. When his weight decreased to the 75th percentile, his blood pressure decreased  to 111/69. When the weight then increased to the 82nd percentile, the blood pressure increased to 125/82. His weight percentile peaked at 88% in October 2013, in part due to a large increase in muscle mass. The gynecomastia has gradually resolved over time.   3. The patient's last [facility] visit was on 01/06/21. I continued his current Synthroid dose.  A. In the interim, he has been healthy.    B. He takes his Synthroid, 50 mcg once daily.     4. Pertinent Review of Systems:  Constitutional: Jeremy Duncan feels "good". Energy level is "good". He is active. Eyes: His distance vision has deteriorated a bit over time. He has glasses for distant vision. There are no other recognized eye problems. Neck: The patient has no complaints of anterior neck swelling, soreness, tenderness, pressure, discomfort, or difficulty swallowing.   Heart: Heart rate increases with exercise or other physical activity. The patient has no complaints of palpitations, irregular heart beats, chest pain, or chest pressure.   Gastrointestinal: He probably has less belly hunger than he did at his last visit. Bowel movents seem normal. The patient has no complaints of acid reflux, upset stomach, stomach aches or pains, diarrhea, or constipation.  Hands: He texts and plays video games quite well. He still has his baseline fine tremor.  Legs: Muscle mass and strength seem normal. There are no complaints of numbness, tingling, burning, or pain. No edema is noted.  Feet: There are no obvious foot problems. There are no complaints of numbness, tingling, burning, or pain. No edema is noted. Neurologic: There are no recognized problems with muscle movement and strength, sensation, or coordination. Breasts: Breast tissue is "about the same".  Skin: His striae have mostly faded.       PAST MEDICAL, FAMILY, AND SOCIAL HISTORY  Past Medical History:  Diagnosis Date   Acquired autoimmune hypothyroidism    Dyspepsia    GERD (gastroesophageal reflux  disease)    Pt denies   Goiter    Gynecomastia, male    Heart murmur    used to have slight heart murmur as child, not anymore   Hyperlipidemia type II    Hypertension    "a little high"   Isosexual precocity    Obesity    Prediabetes    "I used to" 15 or 25 years old   Thyroiditis, autoimmune     Family History  Problem Relation Age of Onset   Diabetes Mother        Gestational diabetes mellitus     Current Outpatient Medications:    SYNTHROID 50 MCG tablet, TAKE 1 TABLET BY MOUTH EVERY DAY, Disp: 90 tablet, Rfl: 1   ibuprofen (ADVIL,MOTRIN) 800 MG tablet, Take 1 tablet (800 mg total) by mouth every 8 (eight) hours as needed. (Patient not taking: No sig reported), Disp: 30 tablet, Rfl: 0   reports that he has never smoked. He has never used smokeless tobacco. He reports that he does not drink alcohol and does not use drugs. Pediatric History  Patient Parents/Guardians   Spira,Wanda (Mother/Guardian)   Parilla,Jeremy Duncan (Father)   Other Topics Concern   Not on  file  Social History Narrative   Not on file   1. School and Family: The patient graduated from [employer] in the Spring of 2020. 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 He still lives with his mother, but will get married in September 2022 and move to a new apartment..  2. Activities: He lifts weights 5-6 times per week and does more cardio.  3. Primary Care Provider: Dr. Lupe Carney, [facility]  REVIEW OF SYSTEMS: There are no other significant problems involving Jeremy Duncan's other body systems.   Objective:  Vital Signs:  BP 122/76 (BP Location: Right Arm, Patient Position: Sitting, Cuff Size: Normal)   Pulse 88   Wt 186 lb 3.2 oz (84.5 kg)   BMI 27.33 kg/m    Ht Readings from Last 3 Encounters:  10/14/19 5' 9.21" (1.758 m)  02/25/19 5' 9.29" (1.76 m)  11/13/18  (1.778 m)   Wt Readings from Last 3 Encounters:  07/07/21 186 lb  3.2 oz (84.5 kg)  01/06/21 184 lb 9.6 oz (83.7 kg)  05/11/20 172 lb 12.8 oz (78.4 kg)   Body surface area is 2.03 meters squared.  PHYSICAL EXAM:  Constitutional: The patient appears healthy and quite muscular. His height growth has ceased. His weight has increased about 1.5 pounds. He is about 10% above his ideal Body Weight of 160. He is alert and bright. His affect and insight are normal.    Head: The head is normocephalic. Face: The face appears normal. There are no obvious dysmorphic features. Eyes: The eyes appear to be normally formed and spaced. Gaze is conjugate. There is no obvious arcus or proptosis. Moisture appears normal. Ears: The ears are normally placed and appear externally normal. Mouth: The oropharynx and tongue appear normal. Dentition appears to be normal for age. Oral moisture is normal. Neck: The neck is visibly enlarged. No carotid bruits are noted. The strap muscles are larger. His thyroid is again about 21 grams in size. Today the lobes are symmetrically enlarged. The isthmus is not enlarged today. The consistency of the thyroid gland is normal. The thyroid gland is not tender to palpation. Lungs: The lungs are clear to auscultation. Air movement is good. Heart: Heart rate and rhythm are regular. Heart sounds S1 and S2 are normal. He has grade 1/6 systolic flow murmur that sounds benign. I did not appreciate any pathologic cardiac murmurs. Abdomen: The abdomen is normal in size for the patient's age. He has more abdominal fat than at his last visit. Bowel sounds are normal. There is no obvious hepatomegaly, splenomegaly, or other mass effect.  Arms: Muscle size and bulk are normal for age. Hands: There is a trace tremor today. Phalangeal and metacarpophalangeal joints are normal. Palmar muscles are normal for age. He has no palmar erythema. He has no excess palmar moisture.  Legs: Muscles appear normal for age. No edema is present. Neurologic: Strength is normal for  age in both the upper and lower extremities. Muscle tone is normal. Sensation to touch is normal in both legs.   Breast tissue: Breasts are Tanner stage I. Areolae measure 30 mm on the right and 34 mm on the left, compared with 30 mm on the right and 31 mm on the left at his last visit, and with 25 mm on the right and 25 mm on the left at his prior visit.  Skin: No lesions  LAB DATA:   Labs  07/07/21: HbA1c 4.8%, CBG 100  Labs 01/06/21: HbA1c 5.1%, CBG 99; TSH 2.11, free T4 1.6, free T3 4.1 {All three TFTs increased together in parallel. This patterns is c/w an interim flare up of Hashimoto's thyroiditis.]  Labs 05/11/20: HbA1c 4.8%, CBG 116 after coffee; TSH 1.89, free T4 1.4, free T3 3.9  Labs 10/14/19: TSH 1.72, free T4 1.4, free T3 4.0  Labs 02/25/19; TSH 1.96, free T4 1.4, free T3 3.8  Labs 08/26/18: HbA1c 5.1%; TSH 1.42, free T4 1.3, free T3 4.2  Labs 02/20/18: HbA1c 5.1%, CBG 95; TSH 1.65, free T4 1.4, free T3 3.6  Labs 07/05/17: HbA1c 4.9%, CBG 159; TSH 1.64, free T4 1.3, free T3 3.6  Labs 11/21/16: TSH 0.79, free T4 1.2, free T3 3.5  Lab s 04/24/16: TSH 3.49, free T4 1.0, free T3 3.4 after missing several Synthroid doses  Labs 10/23/15: TSH 1.657, free T4 1.20, free T3 3.5  Labs 05/31/15: HbA1c 5.1%; TSH 2.318, free T4 1.26, free T3 3.9  Labs 11/09/14: 24-hour urine free cortisol 3.6, compared with 65.9 three months previously. 24-hour urine creatinine was 2090; TSH 2.836, free T4 1.04, free T3 4.0  Labs 08/22/14: HbA1c 5.6%, compared with 5.1% art last visit and with 4.4% two years ago. TSH 2.675, free T4 1.18, free T3 4.4  Labs 02/24/13: TSH 1.986, free T4 1.17, free T3 3.7   Labs 12/28/11: TSH 2.544, Free T4 1.49, Free T3 4.1   Labs 05/13/11: TSH 1.642. Free T4 1.21. Free T3 4.0.   Assessment and Plan:   ASSESSMENT:  1.  Hypothyroid:   A. The patient was euthyroid in January 2013, in March 2014, in September 2015, and in December 2015, although his TSH had been >2.0. In  November 2016 his TFTs were mid-range normal. In May 2017, however, his TSH was elevated after missing many Synthroid doses. I asked him to take the medication daily. His TFTs in December 2017 were at about the 80% of the normal range. His TFTs in August 2018, in March 2019, in September 2019, and in March and November 2020, in June 2021, and in February 2022 were mid-euthyroid. We will repeat his TFTs today.   B. His treatment TSH goal is between 1.0-2.0.  C. His current Synthroid dose was good in February 2022, but was still relatively low for a young man of his age. He still has many thyrocytes that are functional, thyrocytes that are subject to further attack by his Hashimoto's T lymphocytes. He will likely lose more thyrocytes over time and require higher replacement doses of Synthroid.  D. We will draw his TFTs today and call him with the results.   2.  Thyroiditis: His thyroid inflammation is clinically quiescent. The shift of all three TFTs upward or downward together that he has had in the past, and had again in February 2022, is pathognomonic for a flare up of Hashimoto's disease.  3.  Goiter: Thyroid gland is about the same size overall. The process of waxing and waning of the thyroid gland size is consistent with episodes of thyroid inflammation. 4. Overweight: Jeremy Duncan is muscular, but he has gained weight. He is not overweight. 5. Hypertension: His BP is good today.  6. Dyspepsia: This problem is not significant.      7. Pre-diabetes:   A. His A1c was mid-range normal in March and in September 2019. His HbA1c today is higher and is in the upper half of the adult normal range.   B. He is not "  pre-diabetic" any longer in a clinical sense. Unless he gains a lot of fat weight there will not be any need to follow this problem.  8. Striae: His striae have essentially resolved.  9. Large breasts: His areolae have increased in size, paralleling his gain in fat weight.   PLAN:  1. Diagnostic:  TFTs today and again in 6 months at his next visit. 2. Therapeutic:  Continue current dose of Synthroid, but adjust the dose as needed. Try to fit in daily cardio exercise, but at least 3 times per week. I encouraged him to drink more water and cut back on carbs. If his belly hunger worsens he may need to begin treatment with omeprazole.  3. Patient education: We talked about the need to continue to eat right and to exercise after he gets married. We also talked about the natural course of Hashimoto's disease and hypothyroidism. It is likely that he will become progressively more hypothyroid over time. Given his family history of hypertension it would be prudent to do more cardio.  4. Follow-up: 6 months. Call if having more dyspepsia.  Level of Service: This visit lasted in excess of 40 minutes. More than 50% of the visit was devoted to counseling.  Molli KnockMichael Yara Tomkinson, MD, CDE Adult and Pediatric Endocrinology

## 2021-07-07 ENCOUNTER — Encounter (INDEPENDENT_AMBULATORY_CARE_PROVIDER_SITE_OTHER): Payer: Self-pay | Admitting: "Endocrinology

## 2021-07-07 ENCOUNTER — Ambulatory Visit (INDEPENDENT_AMBULATORY_CARE_PROVIDER_SITE_OTHER): Payer: BC Managed Care – PPO | Admitting: "Endocrinology

## 2021-07-07 ENCOUNTER — Other Ambulatory Visit: Payer: Self-pay

## 2021-07-07 VITALS — BP 122/76 | HR 88 | Wt 186.2 lb

## 2021-07-07 DIAGNOSIS — E063 Autoimmune thyroiditis: Secondary | ICD-10-CM | POA: Diagnosis not present

## 2021-07-07 DIAGNOSIS — I10 Essential (primary) hypertension: Secondary | ICD-10-CM

## 2021-07-07 DIAGNOSIS — R7303 Prediabetes: Secondary | ICD-10-CM | POA: Diagnosis not present

## 2021-07-07 DIAGNOSIS — R1013 Epigastric pain: Secondary | ICD-10-CM

## 2021-07-07 DIAGNOSIS — E049 Nontoxic goiter, unspecified: Secondary | ICD-10-CM | POA: Diagnosis not present

## 2021-07-07 LAB — POCT GLUCOSE (DEVICE FOR HOME USE): Glucose Fasting, POC: 100 mg/dL — AB (ref 70–99)

## 2021-07-07 LAB — T3, FREE: T3, Free: 3.3 pg/mL (ref 2.3–4.2)

## 2021-07-07 LAB — POCT GLYCOSYLATED HEMOGLOBIN (HGB A1C): Hemoglobin A1C: 4.8 % (ref 4.0–5.6)

## 2021-07-07 LAB — T4, FREE: Free T4: 1.4 ng/dL (ref 0.8–1.8)

## 2021-07-07 LAB — TSH: TSH: 1.02 mIU/L (ref 0.40–4.50)

## 2021-07-07 NOTE — Patient Instructions (Signed)
Follow up visit in 6 months. 

## 2021-07-08 ENCOUNTER — Encounter (INDEPENDENT_AMBULATORY_CARE_PROVIDER_SITE_OTHER): Payer: Self-pay

## 2021-08-22 ENCOUNTER — Other Ambulatory Visit (INDEPENDENT_AMBULATORY_CARE_PROVIDER_SITE_OTHER): Payer: Self-pay | Admitting: "Endocrinology

## 2021-08-22 DIAGNOSIS — E034 Atrophy of thyroid (acquired): Secondary | ICD-10-CM

## 2022-01-09 ENCOUNTER — Ambulatory Visit (INDEPENDENT_AMBULATORY_CARE_PROVIDER_SITE_OTHER): Payer: BC Managed Care – PPO | Admitting: "Endocrinology

## 2022-02-14 ENCOUNTER — Encounter (INDEPENDENT_AMBULATORY_CARE_PROVIDER_SITE_OTHER): Payer: Self-pay | Admitting: "Endocrinology

## 2022-02-22 ENCOUNTER — Ambulatory Visit (INDEPENDENT_AMBULATORY_CARE_PROVIDER_SITE_OTHER): Payer: BC Managed Care – PPO | Admitting: "Endocrinology

## 2022-03-13 ENCOUNTER — Other Ambulatory Visit (INDEPENDENT_AMBULATORY_CARE_PROVIDER_SITE_OTHER): Payer: Self-pay | Admitting: "Endocrinology

## 2022-03-13 DIAGNOSIS — E034 Atrophy of thyroid (acquired): Secondary | ICD-10-CM

## 2022-03-26 NOTE — Progress Notes (Signed)
Subjective:  ?Duncan Name: Jeremy Duncan Date of Birth: 05-24-1996  MRN: [id number] ? ?Jeremy Duncan  presents to the office today for follow-up of his acquired hypothyroidism, Hashimoto's disease, goiter, obesity, hyperlipidemia, hypertension, prediabetes, gynecomastia, dyspepsia, and GERD. ? ?HISTORY OF PRESENT ILLNESS:  ? ?Jeremy Duncan is a 26 y.o. Caucasian young man. Jeremy Duncan was unaccompanied. ? ?1. Jeremy Duncan was first referred to me on 10/21/07 by his PCP, Dr. Victorino [facility]ikeJennifer Duncan of [facility], for evaluation and management of hypothyroidism. He was 11 years and 7 months old at the time. ? A. The child was the product of an uncomplicated pregnancy. His mother had gestational diabetes mellitus. He was delivered by C-section. Birth weight was 6 lbs. 4 oz. He was a healthy newborn. He was also a healthy infant. Between the ages of 89 and 9 he experienced a marked increase in weight. One year he gained 20 pounds. He also developed hypertension. Laboratory tests on 06/13/07 showed a TSH of 6.09. Repeat studies on 06/17/07 showed a TSH of 6.283. Free T4 was 1.22. Dr. Vaughan BastaSummer called me and I suggested that she start him on Synthroid 25 mcg per day. Thyroid function tests on 07/10/2007 showed that the TSH decreased to 4.055. I suggested that the Synthroid dose be increased to 37.5 mcg per day.  ? B. The Duncan's pertinent review of systems revealed that he was always hungry. He had been placed on Prevacid but was only taking it as needed. Previous laboratory tests had shown hypercholesterolemia and hyperinsulinemia. A fasting insulin value was 20 when the accompanying blood glucose was 85. Jeremy history was positive for a maternal great-grandmother who took thyroid pills. It was believed that she had never had surgery or radiation to her neck. That same maternal great-grandmother had had cancer of her breast and bone. Both the father and mother had significant stomach acid issues.There was a Jeremy history of  overweight/obesity. [Addendum 08/26/18: There is a history of high BP on his mom's side of the Jeremy.] ? C. On physical examination his height was at the 75th percentile. His weight was at the 97th percentile. BMI was greater than the 97th percentile. His blood pressure was 131/86. He was definitely obese. He had a 12 gm goiter. His right lobe was tender to palpation. Breast tissue was early Tanner stage II. The right areola was 22 mm. The left areola was 25 mm. He had Tanner stage II pubic hair. Testes were to 2-3 mL in volume. Lab tests that day showed a normal CMP. His TSH was 2.773. His free T4 was 1.14. His free T3 was 1.3. FSH was 2.8 and LH 0.4. Testosterone was 25.3. Estradiol was less than 10. Androstenedione was 65. ? D. It appeared at that time that the Duncan had acquired primary hypothyroidism secondary to Hashimoto's disease. His thyroid gland was definitely tender, consistent with a flare-up of Hashimoto's disease at that time. He was obese, in part due to extreme hunger caused by dyspepsia. Jeremy history was definitely positive for dyspepsia and overweight/obesity. Previous labs had shown hyperinsulinemia. Therefore, it was predictable that his testosterone would be slightly elevated for age. I felt that his hypertension was most likely a result of his obesity and that if he could lose enough weight, the hypertension would likely resolve as well. I started the child on metformin, 500 mg twice daily. I discussed with the Jeremy our Eat Right diet plan and our Exercise Right plan. ? ?2. During the past 14 years, Donevin has lost some additional thyroid  cells. As a result, I increased his Synthroid dose to 50 mcg/day. Since then he has remained euthyroid. During the first year, his weight dropped from the 97th to the 75th percentile, but then gradually rose again to about the 82nd percentile. He had a growth spurt at age 58. When his weight decreased to the 75th percentile, his blood pressure decreased  to 111/69. When the weight then increased to the 82nd percentile, the blood pressure increased to 125/82. His weight percentile peaked at 88% in October 2013, in part due to a large increase in muscle mass. The gynecomastia has gradually resolved over time.  ? ?3. The Duncan's last [facility] visit was on 07/07/21. I continued his current Synthroid dose of 50 mcg/day. ? A. In the interim, he has been healthy.   ? B. He takes his Synthroid, 50 mcg once daily.    ? ?4. Pertinent Review of Systems:  ?Constitutional: Neils feels "good". Energy level is "good". He is active and works out 4-5 times per week.Marland Kitchen ?Eyes: he had an eye exam in February 2023. His distance vision has deteriorated a bit over time. He has glasses for distant vision. There are no other recognized eye problems. ?Neck: The Duncan has no complaints of anterior neck swelling, soreness, tenderness, pressure, discomfort, or difficulty swallowing.   ?Heart: Heart rate increases with exercise or other physical activity. The Duncan has no complaints of palpitations, irregular heart beats, chest pain, or chest pressure.   ?Gastrointestinal: He probably has about the same amount of belly hunger than he did at his last visit. Bowel movents seem normal. The Duncan has no complaints of acid reflux, upset stomach, stomach aches or pains, diarrhea, or constipation.  ?Hands: He texts and plays video games quite well. He still has his baseline fine tremor.  ?Legs: Muscle mass and strength seem normal. There are no complaints of numbness, tingling, burning, or pain. No edema is noted.  ?Feet: There are no obvious foot problems. There are no complaints of numbness, tingling, burning, or pain. No edema is noted. ?Neurologic: There are no recognized problems with muscle movement and strength, sensation, or coordination. ?Breasts: Breast tissue is "about the same".  ?Skin: His striae have mostly faded.      ? ?PAST MEDICAL, Jeremy, AND SOCIAL HISTORY ? ?Past Medical  History:  ?Diagnosis Date  ? Acquired autoimmune hypothyroidism   ? Dyspepsia   ? GERD (gastroesophageal reflux disease)   ? Pt denies  ? Goiter   ? Gynecomastia, male   ? Heart murmur   ? used to have slight heart murmur as child, not anymore  ? Hyperlipidemia type II   ? Hypertension   ? "a little high"  ? Isosexual precocity   ? Obesity   ? Prediabetes   ? "I used to" 65 or 26 years old  ? Thyroiditis, autoimmune   ? ? ?Jeremy History  ?Problem Relation Age of Onset  ? Diabetes Mother   ?     Gestational diabetes mellitus  ? ? ? ?Current Outpatient Medications:  ?  SYNTHROID 50 MCG tablet, TAKE 1 TABLET BY MOUTH EVERY DAY, Disp: 30 tablet, Rfl: 5 ?  ibuprofen (ADVIL,MOTRIN) 800 MG tablet, Take 1 tablet (800 mg total) by mouth every 8 (eight) hours as needed. (Duncan not taking: Reported on 02/25/2019), Disp: 30 tablet, Rfl: 0 ? ? reports that he has never smoked. He has never used smokeless tobacco. He reports that he does not drink alcohol and does not use drugs. ?  Pediatric History  ?Duncan Parents/Guardians  ? Paden,Wanda (Mother/Guardian)  ? Reffett,Darin (Father)  ? ?Other Topics Concern  ? Not on file  ?Social History Narrative  ? Not on file  ? ?1. School and Jeremy: The Duncan graduated from [employer] in the Spring of 2020. 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 He was married in September 2022 and move to a new apartment in [place]. ?2. Activities: He lifts weights 4-5 times per week and does more cardio.  ?3. Primary Care Provider: Dr. Lupe Carney, [facility] ? ?REVIEW OF SYSTEMS: There are no other significant problems involving Kaidin's other body systems. ? ? Objective:  ?Vital Signs: ? ?BP 122/82 (BP Location: Right Arm, Duncan Position: Sitting, Cuff Size: Large)   Pulse 92   Wt 187 lb 6.4 oz (85 kg)   BMI 27.50 kg/m?  ? Repeat BP 110-78.  ? ?Ht Readings from Last 3 Encounters:  ?10/14/19 5' 9.21" (1.758 m)   ?02/25/19 5' 9.29" (1.76 m)  ?11/13/18 5\' 10"  (1.778 m)  ? ?Wt Readings from Last 3 Encounters:  ?03/27/22 187 lb 6.4 oz (85 kg)  ?07/07/21 186 lb 3.2 oz (84.5 kg)  ?01/06/21 184 lb 9.6 oz (83.7 kg)  ? ?Body surface area is 2

## 2022-03-27 ENCOUNTER — Ambulatory Visit (INDEPENDENT_AMBULATORY_CARE_PROVIDER_SITE_OTHER): Payer: BC Managed Care – PPO | Admitting: "Endocrinology

## 2022-03-27 ENCOUNTER — Encounter (INDEPENDENT_AMBULATORY_CARE_PROVIDER_SITE_OTHER): Payer: Self-pay | Admitting: "Endocrinology

## 2022-03-27 VITALS — BP 110/78 | HR 92 | Wt 187.4 lb

## 2022-03-27 DIAGNOSIS — R7303 Prediabetes: Secondary | ICD-10-CM

## 2022-03-27 DIAGNOSIS — R1013 Epigastric pain: Secondary | ICD-10-CM

## 2022-03-27 DIAGNOSIS — E063 Autoimmune thyroiditis: Secondary | ICD-10-CM | POA: Diagnosis not present

## 2022-03-27 DIAGNOSIS — E049 Nontoxic goiter, unspecified: Secondary | ICD-10-CM

## 2022-03-27 DIAGNOSIS — N62 Hypertrophy of breast: Secondary | ICD-10-CM

## 2022-03-27 NOTE — Patient Instructions (Addendum)
Follow up visit on 6 months. Please repeat lab tests 1-2 weeks prior if possible. ? ?At [facility], we are committed to providing exceptional care. You will receive a patient satisfaction survey through text or email regarding your visit today. Your opinion is important to me. Comments are appreciated. ? ? ?

## 2022-03-28 ENCOUNTER — Encounter (INDEPENDENT_AMBULATORY_CARE_PROVIDER_SITE_OTHER): Payer: Self-pay

## 2022-03-28 LAB — HEMOGLOBIN A1C
Hgb A1c MFr Bld: 5.1 % of total Hgb (ref <5.7)
Mean Plasma Glucose: 100 mg/dL
eAG (mmol/L): 5.5 mmol/L

## 2022-03-28 LAB — T4, FREE: Free T4: 1.2 ng/dL (ref 0.8–1.8)

## 2022-03-28 LAB — TSH: TSH: 2.35 mIU/L (ref 0.40–4.50)

## 2022-03-28 LAB — T3, FREE: T3, Free: 4.1 pg/mL (ref 2.3–4.2)

## 2022-04-19 DIAGNOSIS — K625 Hemorrhage of anus and rectum: Secondary | ICD-10-CM | POA: Diagnosis not present

## 2022-07-06 ENCOUNTER — Encounter (INDEPENDENT_AMBULATORY_CARE_PROVIDER_SITE_OTHER): Payer: Self-pay

## 2022-08-23 NOTE — Progress Notes (Signed)
Subjective:  Patient Name: Jeremy Duncan Date of Birth: [birth year]  MRN: [id number]  Jeremy Duncan  presents to the office today for follow-up of his acquired hypothyroidism, Hashimoto's disease, goiter, obesity, hyperlipidemia, hypertension, prediabetes, gynecomastia, dyspepsia, and GERD.  HISTORY OF PRESENT ILLNESS:   Jeremy Duncan is a 26 y.o. Caucasian young man. Jeremy Duncan was unaccompanied.  1. Jeremy Duncan was first referred to me on 10/21/07 by his PCP, Dr. Victorino DikeJennifer Summer of [facility], for evaluation and management of hypothyroidism. He was 11 years and 7 months old at the time.  A. The child was the product of an uncomplicated pregnancy. His mother had gestational diabetes mellitus. He was delivered by C-section. Birth weight was 6 lbs. 4 oz. He was a healthy newborn. He was also a healthy infant. Between the ages of 89 and 9 he experienced a marked increase in weight. One year he gained 20 pounds. He also developed hypertension. Laboratory tests on 06/13/07 showed a TSH of 6.09. Repeat studies on 06/17/07 showed a TSH of 6.283. Free T4 was 1.22. Dr. Vaughan BastaSummer called me and I suggested that she start him on Synthroid 25 mcg per day. Thyroid function tests on 07/10/2007 showed that the TSH decreased to 4.055. I suggested that the Synthroid dose be increased to 37.5 mcg per day.   B. The patient's pertinent review of systems revealed that he was always hungry. He had been placed on Prevacid but was only taking it as needed. Previous laboratory tests had shown hypercholesterolemia and hyperinsulinemia. A fasting insulin value was 20 when the accompanying blood glucose was 85. Family history was positive for a maternal great-grandmother who took thyroid pills. It was believed that she had never had surgery or radiation to her neck. That same maternal great-grandmother had had cancer of her breast and bone. Both the father and mother had significant stomach acid issues.There was a family history of  overweight/obesity. [Addendum 08/26/18: There is a history of high BP on his mom's side of the family.]  C. On physical examination his height was at the 75th percentile. His weight was at the 97th percentile. BMI was greater than the 97th percentile. His blood pressure was 131/86. He was definitely obese. He had a 12 gm goiter. His right lobe was tender to palpation. Breast tissue was early Tanner stage II. The right areola was 22 mm. The left areola was 25 mm. He had Tanner stage II pubic hair. Testes were to 2-3 mL in volume. Lab tests that day showed a normal CMP. His TSH was 2.773. His free T4 was 1.14. His free T3 was 1.3. FSH was 2.8 and LH 0.4. Testosterone was 25.3. Estradiol was less than 10. Androstenedione was 65.  D. It appeared at that time that the patient had acquired primary hypothyroidism secondary to Hashimoto's disease. His thyroid gland was definitely tender, consistent with a flare-up of Hashimoto's disease at that time. He was obese, in part due to extreme hunger caused by dyspepsia. Family history was definitely positive for dyspepsia and overweight/obesity. Previous labs had shown hyperinsulinemia. Therefore, it was predictable that his testosterone would be slightly elevated for age. I felt that his hypertension was most likely a result of his obesity and that if he could lose enough weight, the hypertension would likely resolve as well. I started the child on metformin, 500 mg twice daily. I discussed with the family our Eat Right diet plan and our Exercise Right plan.  2. During the past 15 years, Jeremy Duncan has lost some additional thyroid  cells. As a result, I increased his Synthroid dose to 50 mcg/day. Since then he has remained euthyroid. During the first year, his weight dropped from the 97th to the 75th percentile, but then gradually rose again to about the 82nd percentile. He had a growth spurt at age 61. When his weight decreased to the 75th percentile, his blood pressure decreased  to 111/69. When the weight then increased to the 82nd percentile, the blood pressure increased to 125/82. His weight percentile peaked at 88% in October 2013, in part due to a large increase in muscle mass. The gynecomastia has gradually resolved over time.   3. The patient's last [facility] visit was on 03/27/22. I continued his current Synthroid dose of 50 mcg/day.  A. In the interim, he has been healthy.    B. He takes his Synthroid, 50 mcg once daily.     4. Pertinent Review of Systems:  Constitutional: Jeremy Duncan feels "pretty good". Energy level is "good". He is active and works out 4-5 times per week.. Eyes: He had an eye exam in February 2023. His distance vision has deteriorated a bit over time. He has glasses for distant vision. There are no other recognized eye problems. Neck: The patient has no complaints of anterior neck swelling, soreness, tenderness, pressure, discomfort, or difficulty swallowing.   Heart: Heart rate increases with exercise or other physical activity. The patient occasionally feels a "skipped beat". has no complaints of palpitations, irregular heart beats, chest pain, or chest pressure.   Gastrointestinal: He probably has about the same amount of belly hunger than he did at his last visit. Bowel movents seem normal. The patient has had some acid reflux and upset stomach, which responded to reducing his caffeine intake. He has not had any other stomach aches or pains, diarrhea, or constipation.  Hands: He texts and plays video games quite well. He still has his baseline fine tremor.  Legs: Muscle mass and strength seem normal. There are no complaints of numbness, tingling, burning, or pain. No edema is noted.  Feet: There are no obvious foot problems. There are no complaints of numbness, tingling, burning, or pain. No edema is noted. Neurologic: There are no recognized problems with muscle movement and strength, sensation, or coordination. Breasts: Breast tissue is "about the  same".  Skin: His striae have mostly faded.       PAST MEDICAL, FAMILY, AND SOCIAL HISTORY  Past Medical History:  Diagnosis Date   Acquired autoimmune hypothyroidism    Dyspepsia    GERD (gastroesophageal reflux disease)    Pt denies   Goiter    Gynecomastia, male    Heart murmur    used to have slight heart murmur as child, not anymore   Hyperlipidemia type II    Hypertension    "a little high"   Isosexual precocity    Obesity    Prediabetes    "I used to" 5 or 26 years old   Thyroiditis, autoimmune     Family History  Problem Relation Age of Onset   Diabetes Mother        Gestational diabetes mellitus     Current Outpatient Medications:    SYNTHROID 50 MCG tablet, TAKE 1 TABLET BY MOUTH EVERY DAY, Disp: 30 tablet, Rfl: 5   ibuprofen (ADVIL,MOTRIN) 800 MG tablet, Take 1 tablet (800 mg total) by mouth every 8 (eight) hours as needed. (Patient not taking: Reported on 02/25/2019), Disp: 30 tablet, Rfl: 0   reports that he has never smoked.  He has never used smokeless tobacco. He reports that he does not drink alcohol and does not use drugs. 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   1. School and Family: The patient graduated from [employer] in the Spring of 2020. 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 He was married in September 2022 and move to a new apartment in [place]. 2. Activities: He lifts weights 4-5 times per week and does more cardio.  3. Primary Care Provider: Dr. Donnie Coffin, [facility]  REVIEW OF SYSTEMS: There are no other significant problems involving Naithen's other body systems.   Objective:  Vital Signs:  BP 124/78 (BP Location: Right Arm, Patient Position: Sitting)   Pulse 84   Wt 185 lb 6.4  oz (84.1 kg)   BMI 27.21 kg/m    Ht Readings from Last 3 Encounters:  10/14/19 5' 9.21" (1.758 m)  02/25/19 5' 9.29" (1.76 m)  11/13/18 5\' 10"  (1.778 m)   Wt Readings from Last 3 Encounters:  08/24/22 185 lb 6.4 oz (84.1 kg)  03/27/22 187 lb 6.4 oz (85 kg)  07/07/21 186 lb 3.2 oz (84.5 kg)   Body surface area is 2.03 meters squared.  PHYSICAL EXAM:  Constitutional: The patient appears healthy and quite muscular. His height growth has ceased. His weight has decreased 2 pounds He is about 10% above his ideal Body Weight of 160. He is alert and bright. His affect and insight are normal.    Head: The head is normocephalic. Face: The face appears normal. There are no obvious dysmorphic features. Eyes: The eyes appear to be normally formed and spaced. Gaze is conjugate. There is no obvious arcus or proptosis. Moisture appears normal. Ears: The ears are normally placed and appear externally normal. Mouth: The oropharynx and tongue appear normal. Dentition appears to be normal for age. Oral moisture is normal. Neck: The neck is visibly enlarged. No carotid bruits are noted. The strap muscles are larger. His thyroid is again about 21 grams in size. Today the left lobe is larger than the right. The isthmus is not enlarged today. The consistency of the thyroid gland is relatively full on the left and normal on the right. The thyroid gland is not tender to palpation. Lungs: The lungs are clear to auscultation. Air movement is good. Heart: Heart rate and rhythm are regular. Heart sounds S1 and S2 are normal. I did not appreciate any pathologic cardiac murmurs. Abdomen: The abdomen is normal in size for the patient's age. He has more abdominal fat than at his last visit. Bowel sounds are normal. There is no obvious hepatomegaly, splenomegaly, or other mass effect.  Arms: Muscle size and bulk are normal for age. Hands: There is no tremor today. Phalangeal and metacarpophalangeal joints are normal.  Palmar muscles are normal for age. He has no palmar erythema. He has no excess palmar moisture.  Legs: Muscles appear normal for age. No edema is present. Neurologic: Strength is normal for age in both the upper and lower extremities. Muscle tone is normal. Sensation to touch is normal in both legs.   Breast tissue: At his visit in August 2022, his breasts were Tanner stage I. Areolae measured 30 mm on the right and 34 mm on the left, compared  with 30 mm on the right and 31 mm on the left at his last visit, and with 25 mm on the right and 25 mm on the left at his prior visit. I did not feel breast buds.  Skin: No lesions  LAB DATA:   Labs 08/24/22: HbA1c 5.0%, CBG 141  Labs 03/27/22: HbA1c 5.1%; TSH 2.35, free T4 1.2, free T3 4.1  Labs 07/07/21: HbA1c 4.8%, CBG 100; TSH 1.02, free T4 1.4, free T3 3.3  Labs 01/06/21: HbA1c 5.1%, CBG 99; TSH 2.11, free T4 1.6, free T3 4.1 {All three TFTs increased together in parallel. This patterns is c/w an interim flare up of Hashimoto's thyroiditis.]  Labs 05/11/20: HbA1c 4.8%, CBG 116 after coffee; TSH 1.89, free T4 1.4, free T3 3.9  Labs 10/14/19: TSH 1.72, free T4 1.4, free T3 4.0  Labs 02/25/19; TSH 1.96, free T4 1.4, free T3 3.8  Labs 08/26/18: HbA1c 5.1%; TSH 1.42, free T4 1.3, free T3 4.2  Labs 02/20/18: HbA1c 5.1%, CBG 95; TSH 1.65, free T4 1.4, free T3 3.6  Labs 07/05/17: HbA1c 4.9%, CBG 159; TSH 1.64, free T4 1.3, free T3 3.6  Labs 11/21/16: TSH 0.79, free T4 1.2, free T3 3.5  Lab s 04/24/16: TSH 3.49, free T4 1.0, free T3 3.4 after missing several Synthroid doses  Labs 10/23/15: TSH 1.657, free T4 1.20, free T3 3.5  Labs 05/31/15: HbA1c 5.1%; TSH 2.318, free T4 1.26, free T3 3.9  Labs 11/09/14: 24-hour urine free cortisol 3.6, compared with 65.9 three months previously. 24-hour urine creatinine was 2090; TSH 2.836, free T4 1.04, free T3 4.0  Labs 08/22/14: HbA1c 5.6%, compared with 5.1% art last visit and with 4.4% two years ago. TSH 2.675,  free T4 1.18, free T3 4.4  Labs 02/24/13: TSH 1.986, free T4 1.17, free T3 3.7   Labs 12/28/11: TSH 2.544, Free T4 1.49, Free T3 4.1   Labs 05/13/11: TSH 1.642. Free T4 1.21. Free T3 4.0.   Assessment and Plan:   ASSESSMENT:  1.  Hypothyroid:   A. The patient was euthyroid in January 2013, in March 2014, in September 2015, and in December 2015, although his TSH had been >2.0. In November 2016 his TFTs were mid-range normal. In May 2017, however, his TSH was elevated after missing many Synthroid doses. I asked him to take the medication daily. His TFTs in December 2017 were at about the 80% of the normal range. His TFTs were mid-euthyroid in August 2018, in March 2019, in September 2019, in March and November 2020, in June 2021, and in February and August 2022.   B. His treatment TSH goal is between 1.0-2.0.  C. His current Synthroid dose was good in February and August 2022 and in April 2023, but was still relatively low for a young man of his age. He still has many thyrocytes that are functional, thyrocytes that are subject to further attack by his Hashimoto's T lymphocytes. He will likely lose more thyrocytes over time and require higher replacement doses of Synthroid.  D. We will draw his TFTs today and call him with the results.   2.  Thyroiditis: His thyroid inflammation is clinically quiescent. The shift of all three TFTs upward or downward together that he has had in the past, and had again in February 2022, is pathognomonic for a flare up of Hashimoto's disease.  3.  Goiter: Thyroid gland is about the same size overall, but the lobes have shifted in size once again. The process of  waxing and waning of the thyroid gland size is consistent with episodes of thyroid inflammation. 4. Overweight: Tresean is muscular and has lost two pounds due to performing more cardio. He is not overweight. 5. Hypertension: His BP is normal today.  6. Dyspepsia: This problem had increased   due to having  increased his caffeine intake, but resolved after decreasing that intake.       7. Pre-diabetes:   A. His A1c was mid-range normal in March and in September 2019. Since then his HbA1c values have remained in the 4.8-5.1% range, to include 5.0% today in September 2023. .   B. He is not "pre-diabetic" any longer in a clinical sense. Unless he gains a lot of fat weight there will not be any need to follow this problem.  8. Striae: His striae have essentially resolved.  9. Large breasts: His large breasts have improved over time.   PLAN:  1. Diagnostic: TFTs and HbA1c today. 2. Therapeutic:  Continue current dose of Synthroid, but adjust the dose as needed. Try to fit in daily cardio exercise, but at least 3 times per week. I encouraged him to drink more water and cut back on carbs. If his belly hunger worsens he may need to begin treatment with omeprazole.  3. Patient education: We talked about the need to continue to eat right and to exercise after he gets married. We also talked about the natural course of Hashimoto's disease and hypothyroidism. It is likely that he will become progressively more hypothyroid over time. Given his family history of hypertension it would be prudent to do more cardio.  4. Follow-up: 6 months with PCP and/or endocrinologist at [facility].   Level of Service: This visit lasted in excess of 45 minutes. More than 50% of the visit was devoted to counseling.  Molli Knock, MD, CDE Adult and Pediatric Endocrinology

## 2022-08-24 ENCOUNTER — Ambulatory Visit (INDEPENDENT_AMBULATORY_CARE_PROVIDER_SITE_OTHER): Payer: BC Managed Care – PPO | Admitting: "Endocrinology

## 2022-08-24 ENCOUNTER — Encounter (INDEPENDENT_AMBULATORY_CARE_PROVIDER_SITE_OTHER): Payer: Self-pay | Admitting: "Endocrinology

## 2022-08-24 VITALS — BP 124/78 | HR 84 | Wt 185.4 lb

## 2022-08-24 DIAGNOSIS — R1013 Epigastric pain: Secondary | ICD-10-CM | POA: Diagnosis not present

## 2022-08-24 DIAGNOSIS — R7303 Prediabetes: Secondary | ICD-10-CM

## 2022-08-24 DIAGNOSIS — I10 Essential (primary) hypertension: Secondary | ICD-10-CM | POA: Diagnosis not present

## 2022-08-24 DIAGNOSIS — E663 Overweight: Secondary | ICD-10-CM

## 2022-08-24 DIAGNOSIS — E063 Autoimmune thyroiditis: Secondary | ICD-10-CM | POA: Diagnosis not present

## 2022-08-24 DIAGNOSIS — E034 Atrophy of thyroid (acquired): Secondary | ICD-10-CM

## 2022-08-24 DIAGNOSIS — E049 Nontoxic goiter, unspecified: Secondary | ICD-10-CM

## 2022-08-24 LAB — POCT GLYCOSYLATED HEMOGLOBIN (HGB A1C): Hemoglobin A1C: 5 % (ref 4.0–5.6)

## 2022-08-24 LAB — POCT GLUCOSE (DEVICE FOR HOME USE): POC Glucose: 141 mg/dl — AB (ref 70–99)

## 2022-08-24 MED ORDER — SYNTHROID 50 MCG PO TABS: 50.0000 ug | ORAL_TABLET | Freq: Every day | ORAL | 11 refills | Status: DC

## 2022-08-24 NOTE — Patient Instructions (Signed)
No further follow up here. I recommend seeing your primary care provider in about 6 months to have your thyroid tests re-checked. You may also need follow up with [facility], either Dr. Delrae Rend or Dr. Alanson Aly.   [facility], we are committed to providing exceptional care. You will receive a patient satisfaction survey through text or email regarding your visit today. Your opinion is important to me. Comments are appreciated.

## 2022-08-25 LAB — TSH: TSH: 1.73 mIU/L (ref 0.40–4.50)

## 2022-08-25 LAB — T3, FREE: T3, Free: 4.4 pg/mL — ABNORMAL HIGH (ref 2.3–4.2)

## 2022-08-25 LAB — T4, FREE: Free T4: 1.4 ng/dL (ref 0.8–1.8)

## 2022-09-04 ENCOUNTER — Telehealth (INDEPENDENT_AMBULATORY_CARE_PROVIDER_SITE_OTHER): Payer: Self-pay | Admitting: "Endocrinology

## 2022-09-04 DIAGNOSIS — E034 Atrophy of thyroid (acquired): Secondary | ICD-10-CM

## 2022-09-04 DIAGNOSIS — E063 Autoimmune thyroiditis: Secondary | ICD-10-CM

## 2022-09-04 MED ORDER — SYNTHROID 50 MCG PO TABS: 50.0000 ug | ORAL_TABLET | Freq: Every day | ORAL | 3 refills | Status: AC

## 2022-09-04 NOTE — Telephone Encounter (Signed)
  Name of who is calling: Alvie  Best contact number: [phone]  Provider they see: Dr. Tobe Sos  Reason for call: Fredderick is calling asking can 90 day for his synthroid be called into the pharmacy. He states its cheaper.

## 2022-09-14 ENCOUNTER — Encounter (INDEPENDENT_AMBULATORY_CARE_PROVIDER_SITE_OTHER): Payer: Self-pay

## 2022-09-26 ENCOUNTER — Ambulatory Visit (INDEPENDENT_AMBULATORY_CARE_PROVIDER_SITE_OTHER): Payer: BC Managed Care – PPO | Admitting: "Endocrinology

## 2022-11-01 DIAGNOSIS — Z0000 Encounter for general adult medical examination without abnormal findings: Secondary | ICD-10-CM | POA: Diagnosis not present

## 2022-11-01 DIAGNOSIS — Z23 Encounter for immunization: Secondary | ICD-10-CM | POA: Diagnosis not present

## 2022-11-29 DIAGNOSIS — K602 Anal fissure, unspecified: Secondary | ICD-10-CM | POA: Diagnosis not present

## 2023-11-07 DIAGNOSIS — E039 Hypothyroidism, unspecified: Secondary | ICD-10-CM | POA: Diagnosis not present

## 2023-11-07 DIAGNOSIS — F411 Generalized anxiety disorder: Secondary | ICD-10-CM | POA: Diagnosis not present

## 2023-11-07 DIAGNOSIS — R799 Abnormal finding of blood chemistry, unspecified: Secondary | ICD-10-CM | POA: Diagnosis not present

## 2023-11-07 DIAGNOSIS — R7303 Prediabetes: Secondary | ICD-10-CM | POA: Diagnosis not present

## 2024-04-23 DIAGNOSIS — L301 Dyshidrosis [pompholyx]: Secondary | ICD-10-CM | POA: Diagnosis not present

## 2024-11-03 DIAGNOSIS — E039 Hypothyroidism, unspecified: Secondary | ICD-10-CM | POA: Diagnosis not present

## 2024-11-03 DIAGNOSIS — Z13220 Encounter for screening for lipoid disorders: Secondary | ICD-10-CM | POA: Diagnosis not present

## 2024-11-03 DIAGNOSIS — Z23 Encounter for immunization: Secondary | ICD-10-CM | POA: Diagnosis not present

## 2024-11-03 DIAGNOSIS — L301 Dyshidrosis [pompholyx]: Secondary | ICD-10-CM | POA: Diagnosis not present

## 2024-11-03 DIAGNOSIS — Z1159 Encounter for screening for other viral diseases: Secondary | ICD-10-CM | POA: Diagnosis not present

## 2024-11-03 DIAGNOSIS — Z0189 Encounter for other specified special examinations: Secondary | ICD-10-CM | POA: Diagnosis not present

## 2024-11-03 DIAGNOSIS — Z131 Encounter for screening for diabetes mellitus: Secondary | ICD-10-CM | POA: Diagnosis not present
